# Patient Record
Sex: Female | Born: 1960
Health system: Southern US, Community
[De-identification: ages and names within clinical notes are randomized; demographics above are authoritative.]

## PROBLEM LIST (undated history)

## (undated) DIAGNOSIS — K219 Gastro-esophageal reflux disease without esophagitis: Secondary | ICD-10-CM

## (undated) HISTORY — PX: TUBAL LIGATION: SHX77

## (undated) HISTORY — PX: PATELLA FRACTURE SURGERY: SHX735

---

## 2014-11-03 ENCOUNTER — Emergency Department (HOSPITAL_COMMUNITY): Payer: BLUE CROSS/BLUE SHIELD

## 2014-11-03 ENCOUNTER — Emergency Department (HOSPITAL_COMMUNITY)
Admission: EM | Admit: 2014-11-03 | Discharge: 2014-11-03 | Disposition: A | Discharge status: Home / Self Care | Payer: BLUE CROSS/BLUE SHIELD | Attending: Physician Assistant | Admitting: Physician Assistant

## 2014-11-03 ENCOUNTER — Encounter (HOSPITAL_COMMUNITY): Payer: Self-pay | Admitting: Emergency Medicine

## 2014-11-03 DIAGNOSIS — Y92410 Unspecified street and highway as the place of occurrence of the external cause: Secondary | ICD-10-CM | POA: Diagnosis not present

## 2014-11-03 DIAGNOSIS — S5291XA Unspecified fracture of right forearm, initial encounter for closed fracture: Secondary | ICD-10-CM

## 2014-11-03 DIAGNOSIS — S59291A Other physeal fracture of lower end of radius, right arm, initial encounter for closed fracture: Secondary | ICD-10-CM | POA: Diagnosis not present

## 2014-11-03 DIAGNOSIS — Y9389 Activity, other specified: Secondary | ICD-10-CM | POA: Diagnosis not present

## 2014-11-03 DIAGNOSIS — Z23 Encounter for immunization: Secondary | ICD-10-CM | POA: Diagnosis not present

## 2014-11-03 DIAGNOSIS — Y998 Other external cause status: Secondary | ICD-10-CM | POA: Diagnosis not present

## 2014-11-03 DIAGNOSIS — S52611A Displaced fracture of right ulna styloid process, initial encounter for closed fracture: Secondary | ICD-10-CM | POA: Diagnosis not present

## 2014-11-03 DIAGNOSIS — S6991XA Unspecified injury of right wrist, hand and finger(s), initial encounter: Secondary | ICD-10-CM | POA: Diagnosis present

## 2014-11-03 LAB — BASIC METABOLIC PANEL
Anion gap: 9 (ref 5–15)
BUN: 18 mg/dL (ref 6–20)
CHLORIDE: 106 mmol/L (ref 101–111)
CO2: 21 mmol/L — AB (ref 22–32)
CREATININE: 0.79 mg/dL (ref 0.44–1.00)
Calcium: 8.7 mg/dL — ABNORMAL LOW (ref 8.9–10.3)
GFR calc non Af Amer: >60 mL/min (ref >60)
Glucose, Bld: 99 mg/dL (ref 65–99)
POTASSIUM: 4 mmol/L (ref 3.5–5.1)
SODIUM: 136 mmol/L (ref 135–145)

## 2014-11-03 LAB — CBC WITH DIFFERENTIAL/PLATELET
Basophils Absolute: 0 10*3/uL (ref 0.0–0.1)
Basophils Relative: 0 % (ref 0–1)
EOS ABS: 0.1 10*3/uL (ref 0.0–0.7)
Eosinophils Relative: 1 % (ref 0–5)
HEMATOCRIT: 39.1 % (ref 36.0–46.0)
HEMOGLOBIN: 13.6 g/dL (ref 12.0–15.0)
LYMPHS ABS: 2.6 10*3/uL (ref 0.7–4.0)
LYMPHS PCT: 23 % (ref 12–46)
MCH: 31.3 pg (ref 26.0–34.0)
MCHC: 34.8 g/dL (ref 30.0–36.0)
MCV: 90.1 fL (ref 78.0–100.0)
MONOS PCT: 5 % (ref 3–12)
Monocytes Absolute: 0.6 10*3/uL (ref 0.1–1.0)
NEUTROS PCT: 71 % (ref 43–77)
Neutro Abs: 8 10*3/uL — ABNORMAL HIGH (ref 1.7–7.7)
Platelets: 272 10*3/uL (ref 150–400)
RBC: 4.34 MIL/uL (ref 3.87–5.11)
RDW: 12.4 % (ref 11.5–15.5)
WBC: 11.3 10*3/uL — AB (ref 4.0–10.5)

## 2014-11-03 MED ORDER — IOHEXOL 350 MG/ML SOLN
75.0000 mL | Freq: Once | INTRAVENOUS | Status: AC | PRN
  Administered 2014-11-03: 75 mL via INTRAVENOUS

## 2014-11-03 MED ORDER — TETANUS-DIPHTH-ACELL PERTUSSIS 5-2.5-18.5 LF-MCG/0.5 IM SUSP
0.5000 mL | Freq: Once | INTRAMUSCULAR | Status: AC
  Administered 2014-11-03: 0.5 mL via INTRAMUSCULAR
  Filled 2014-11-03: qty 0.5

## 2014-11-03 MED ORDER — OXYCODONE-ACETAMINOPHEN 5-325 MG PO TABS: 2.0000 | ORAL_TABLET | ORAL | Status: DC | PRN

## 2014-11-03 MED ORDER — OXYCODONE-ACETAMINOPHEN 5-325 MG PO TABS
1.0000 | ORAL_TABLET | Freq: Once | ORAL | Status: AC
  Administered 2014-11-03: 1 via ORAL
  Filled 2014-11-03: qty 1
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
## 2014-11-03 NOTE — ED Notes (Addendum)
Patient was given a prepackage of Oxycodone-Acetaminophen quantity six and instructions to report to [facility] Short Stay at 10 am, NPO after midnight, patient understood all instructions.

## 2014-11-03 NOTE — Discharge Instructions (Signed)
Follow up with [place] in the morning at [facility] "Short Stay" at 10 am..  You should not eat after midnight. You will be having surgery on your wrist.  Cast or Splint Care Casts and splints support injured limbs and keep bones from moving while they heal. It is important to care for your cast or splint at home.  HOME CARE INSTRUCTIONS  Keep the cast or splint uncovered during the drying period. It can take 24 to 48 hours to dry if it is made of plaster. A fiberglass cast will dry in less than 1 hour.  Do not rest the cast on anything harder than a pillow for the first 24 hours.  Do not put weight on your injured limb or apply pressure to the cast until your health care provider gives you permission.  Keep the cast or splint dry. Wet casts or splints can lose their shape and may not support the limb as well. A wet cast that has lost its shape can also create harmful pressure on your skin when it dries. Also, wet skin can become infected.  Cover the cast or splint with a plastic bag when bathing or when out in the rain or snow. If the cast is on the trunk of the body, take sponge baths until the cast is removed.  If your cast does become wet, dry it with a towel or a blow dryer on the cool setting only.  Keep your cast or splint clean. Soiled casts may be wiped with a moistened cloth.  Do not place any hard or soft foreign objects under your cast or splint, such as cotton, toilet paper, lotion, or powder.  Do not try to scratch the skin under the cast with any object. The object could get stuck inside the cast. Also, scratching could lead to an infection. If itching is a problem, use a blow dryer on a cool setting to relieve discomfort.  Do not trim or cut your cast or remove padding from inside of it.  Exercise all joints next to the injury that are not immobilized by the cast or splint. For example, if you have a long leg cast, exercise the hip joint and toes. If you have an arm cast or  splint, exercise the shoulder, elbow, thumb, and fingers.  Elevate your injured arm or leg on 1 or 2 pillows for the first 1 to 3 days to decrease swelling and pain.It is best if you can comfortably elevate your cast so it is higher than your heart. SEEK MEDICAL CARE IF:   Your cast or splint cracks.  Your cast or splint is too tight or too loose.  You have unbearable itching inside the cast.  Your cast becomes wet or develops a soft spot or area.  You have a bad smell coming from inside your cast.  You get an object stuck under your cast.  Your skin around the cast becomes red or raw.  You have new pain or worsening pain after the cast has been applied. SEEK IMMEDIATE MEDICAL CARE IF:   You have fluid leaking through the cast.  You are unable to move your fingers or toes.  You have discolored (blue or white), cool, painful, or very swollen fingers or toes beyond the cast.  You have tingling or numbness around the injured area.  You have severe pain or pressure under the cast.  You have any difficulty with your breathing or have shortness of breath.  You have chest  pain. Document Released: 02/15/2000 Document Revised: 12/08/2012 Document Reviewed: 08/26/2012 [facility] Patient Information 2015 [place], [place]. This information is not intended to replace advice given to you by your health care provider. Make sure you discuss any questions you have with your health care provider.
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
## 2014-11-04 ENCOUNTER — Ambulatory Visit (HOSPITAL_COMMUNITY)
Admission: EM | Admit: 2014-11-04 | Discharge: 2014-11-04 | Disposition: A | Discharge status: Home / Self Care | Payer: BLUE CROSS/BLUE SHIELD | Source: Ambulatory Visit | Attending: Orthopedic Surgery | Admitting: Orthopedic Surgery

## 2014-11-04 ENCOUNTER — Encounter (HOSPITAL_COMMUNITY): Payer: Self-pay | Admitting: *Deleted

## 2014-11-04 ENCOUNTER — Encounter (HOSPITAL_COMMUNITY)
Admission: EM | Disposition: A | Discharge status: Home / Self Care | Payer: Self-pay | Source: Ambulatory Visit | Attending: Orthopedic Surgery

## 2014-11-04 ENCOUNTER — Ambulatory Visit (HOSPITAL_COMMUNITY): Payer: BLUE CROSS/BLUE SHIELD | Admitting: Anesthesiology

## 2014-11-04 DIAGNOSIS — S52571A Other intraarticular fracture of lower end of right radius, initial encounter for closed fracture: Secondary | ICD-10-CM | POA: Diagnosis not present

## 2014-11-04 HISTORY — PX: ORIF WRIST FRACTURE: SHX2133

## 2014-11-04 HISTORY — DX: Gastro-esophageal reflux disease without esophagitis: K21.9

## 2014-11-04 SURGERY — OPEN REDUCTION INTERNAL FIXATION (ORIF) WRIST FRACTURE
Anesthesia: Regional | Site: Wrist | Laterality: Right

## 2014-11-04 MED ORDER — LIDOCAINE HCL (CARDIAC) 20 MG/ML IV SOLN
INTRAVENOUS | Status: DC | PRN
  Administered 2014-11-04: 50 mg via INTRAVENOUS

## 2014-11-04 MED ORDER — LACTATED RINGERS IV SOLN
INTRAVENOUS | Status: DC
  Administered 2014-11-04: 13:00:00 via INTRAVENOUS

## 2014-11-04 MED ORDER — FENTANYL CITRATE (PF) 100 MCG/2ML IJ SOLN: INTRAMUSCULAR | Status: DC | PRN

## 2014-11-04 MED ORDER — OXYCODONE HCL 5 MG PO TABS: 5.0000 mg | ORAL_TABLET | Freq: Once | ORAL | Status: DC | PRN

## 2014-11-04 MED ORDER — MIDAZOLAM HCL 2 MG/2ML IJ SOLN
INTRAMUSCULAR | Status: AC
  Filled 2014-11-04: qty 4

## 2014-11-04 MED ORDER — FENTANYL CITRATE (PF) 250 MCG/5ML IJ SOLN
INTRAMUSCULAR | Status: AC
  Filled 2014-11-04: qty 5

## 2014-11-04 MED ORDER — PROPOFOL 10 MG/ML IV BOLUS
INTRAVENOUS | Status: AC
Start: 2014-11-04 — End: 2014-11-04
  Filled 2014-11-04: qty 20

## 2014-11-04 MED ORDER — BUPIVACAINE-EPINEPHRINE (PF) 0.25% -1:200000 IJ SOLN
INTRAMUSCULAR | Status: DC | PRN
  Administered 2014-11-04: 30 mL via PERINEURAL

## 2014-11-04 MED ORDER — DEXAMETHASONE SODIUM PHOSPHATE 4 MG/ML IJ SOLN
INTRAMUSCULAR | Status: DC | PRN
  Administered 2014-11-04: 4 mg via INTRAVENOUS

## 2014-11-04 MED ORDER — CEPHALEXIN 500 MG PO CAPS: 500.0000 mg | ORAL_CAPSULE | Freq: Four times a day (QID) | ORAL | Status: DC

## 2014-11-04 MED ORDER — MIDAZOLAM HCL 2 MG/2ML IJ SOLN
INTRAMUSCULAR | Status: DC | PRN
  Administered 2014-11-04 (×2): 1 mg via INTRAVENOUS

## 2014-11-04 MED ORDER — SCOPOLAMINE 1 MG/3DAYS TD PT72
MEDICATED_PATCH | TRANSDERMAL | Status: DC | PRN
  Administered 2014-11-04: 1 via TRANSDERMAL

## 2014-11-04 MED ORDER — ONDANSETRON HCL 4 MG/2ML IJ SOLN
INTRAMUSCULAR | Status: AC
  Filled 2014-11-04: qty 2

## 2014-11-04 MED ORDER — HYDROMORPHONE HCL 1 MG/ML IJ SOLN: 0.2500 mg | INTRAMUSCULAR | Status: DC | PRN

## 2014-11-04 MED ORDER — PROMETHAZINE HCL 25 MG/ML IJ SOLN: 6.2500 mg | INTRAMUSCULAR | Status: DC | PRN

## 2014-11-04 MED ORDER — 0.9 % SODIUM CHLORIDE (POUR BTL) OPTIME
TOPICAL | Status: DC | PRN
  Administered 2014-11-04: 1000 mL

## 2014-11-04 MED ORDER — OXYCODONE HCL 5 MG PO TABS: 10.0000 mg | ORAL_TABLET | ORAL | Status: DC | PRN

## 2014-11-04 MED ORDER — ONDANSETRON HCL 4 MG/2ML IJ SOLN
INTRAMUSCULAR | Status: DC | PRN
  Administered 2014-11-04: 4 mg via INTRAVENOUS

## 2014-11-04 MED ORDER — PHENYLEPHRINE HCL 10 MG/ML IJ SOLN
10.0000 mg | INTRAMUSCULAR | Status: DC | PRN
  Administered 2014-11-04: 60 ug/min via INTRAVENOUS

## 2014-11-04 MED ORDER — FENTANYL CITRATE (PF) 250 MCG/5ML IJ SOLN
INTRAMUSCULAR | Status: DC | PRN
  Administered 2014-11-04: 50 ug via INTRAVENOUS
  Administered 2014-11-04 (×2): 100 ug via INTRAVENOUS

## 2014-11-04 MED ORDER — PHENYLEPHRINE HCL 10 MG/ML IJ SOLN
INTRAMUSCULAR | Status: DC | PRN
  Administered 2014-11-04 (×3): 60 ug via INTRAVENOUS

## 2014-11-04 MED ORDER — OXYCODONE HCL 5 MG/5ML PO SOLN: 5.0000 mg | Freq: Once | ORAL | Status: DC | PRN

## 2014-11-04 MED ORDER — DEXAMETHASONE SODIUM PHOSPHATE 4 MG/ML IJ SOLN
INTRAMUSCULAR | Status: AC
  Filled 2014-11-04: qty 1

## 2014-11-04 MED ORDER — PROPOFOL 10 MG/ML IV BOLUS
INTRAVENOUS | Status: DC | PRN
  Administered 2014-11-04: 30 mg via INTRAVENOUS
  Administered 2014-11-04: 170 mg via INTRAVENOUS

## 2014-11-04 MED ORDER — CEFAZOLIN SODIUM-DEXTROSE 2-3 GM-% IV SOLR
INTRAVENOUS | Status: DC | PRN
  Administered 2014-11-04: 2 g via INTRAVENOUS

## 2014-11-04 MED ORDER — METHOCARBAMOL 500 MG PO TABS: 500.0000 mg | ORAL_TABLET | Freq: Four times a day (QID) | ORAL | Status: DC

## 2014-11-04 MED ORDER — LACTATED RINGERS IV SOLN
INTRAVENOUS | Status: DC | PRN
  Administered 2014-11-04 (×2): via INTRAVENOUS

## 2014-11-04 SURGICAL SUPPLY — 63 items
BANDAGE ELASTIC 3 VELCRO ST LF (GAUZE/BANDAGES/DRESSINGS) ×3 IMPLANT
BANDAGE ELASTIC 4 VELCRO ST LF (GAUZE/BANDAGES/DRESSINGS) ×3 IMPLANT
BIT DRILL 2.2 SS TIBIAL (BIT) ×3 IMPLANT
BLADE SURG ROTATE 9660 (MISCELLANEOUS) IMPLANT
BNDG ESMARK 4X9 LF (GAUZE/BANDAGES/DRESSINGS) ×3 IMPLANT
BNDG GAUZE ELAST 4 BULKY (GAUZE/BANDAGES/DRESSINGS) ×3 IMPLANT
CANISTER SUCTION 2500CC (MISCELLANEOUS) ×3 IMPLANT
CORDS BIPOLAR (ELECTRODE) ×3 IMPLANT
COVER SURGICAL LIGHT HANDLE (MISCELLANEOUS) ×3 IMPLANT
CUFF TOURNIQUET SINGLE 18IN (TOURNIQUET CUFF) ×3 IMPLANT
CUFF TOURNIQUET SINGLE 24IN (TOURNIQUET CUFF) IMPLANT
DRAIN TLS ROUND 10FR (DRAIN) IMPLANT
DRAPE OEC MINIVIEW 54X84 (DRAPES) IMPLANT
DRAPE SURG 17X23 STRL (DRAPES) ×3 IMPLANT
DRSG ADAPTIC 3X8 NADH LF (GAUZE/BANDAGES/DRESSINGS) ×3 IMPLANT
GAUZE SPONGE 4X4 12PLY STRL (GAUZE/BANDAGES/DRESSINGS) ×3 IMPLANT
GAUZE XEROFORM 1X8 LF (GAUZE/BANDAGES/DRESSINGS) ×3 IMPLANT
GLOVE BIOGEL M STRL SZ7.5 (GLOVE) ×3 IMPLANT
GLOVE SS BIOGEL STRL SZ 8 (GLOVE) ×1 IMPLANT
GLOVE SUPERSENSE BIOGEL SZ 8 (GLOVE) ×2
GOWN STRL REUS W/ TWL LRG LVL3 (GOWN DISPOSABLE) ×1 IMPLANT
GOWN STRL REUS W/ TWL XL LVL3 (GOWN DISPOSABLE) ×2 IMPLANT
GOWN STRL REUS W/TWL LRG LVL3 (GOWN DISPOSABLE) ×2
GOWN STRL REUS W/TWL XL LVL3 (GOWN DISPOSABLE) ×4
KIT BASIN OR (CUSTOM PROCEDURE TRAY) ×3 IMPLANT
KIT ROOM TURNOVER OR (KITS) ×3 IMPLANT
LOOP VESSEL MAXI BLUE (MISCELLANEOUS) IMPLANT
MANIFOLD NEPTUNE II (INSTRUMENTS) ×3 IMPLANT
NEEDLE 22X1 1/2 (OR ONLY) (NEEDLE) IMPLANT
NS IRRIG 1000ML POUR BTL (IV SOLUTION) ×3 IMPLANT
PACK ORTHO EXTREMITY (CUSTOM PROCEDURE TRAY) ×3 IMPLANT
PAD ARMBOARD 7.5X6 YLW CONV (MISCELLANEOUS) ×6 IMPLANT
PAD CAST 3X4 CTTN HI CHSV (CAST SUPPLIES) ×1 IMPLANT
PAD CAST 4YDX4 CTTN HI CHSV (CAST SUPPLIES) ×1 IMPLANT
PADDING CAST ABS 3INX4YD NS (CAST SUPPLIES) ×2
PADDING CAST ABS COTTON 3X4 (CAST SUPPLIES) ×1 IMPLANT
PADDING CAST COTTON 3X4 STRL (CAST SUPPLIES) ×2
PADDING CAST COTTON 4X4 STRL (CAST SUPPLIES) ×2
PEG LOCKING SMOOTH 2.2X16 (Screw) ×6 IMPLANT
PEG LOCKING SMOOTH 2.2X18 (Peg) ×9 IMPLANT
PLATE DVR VOLAR RIM NARROW RT (Plate) ×3 IMPLANT
SCREW LOCK 12X2.7X 3 LD (Screw) ×2 IMPLANT
SCREW LOCK 14X2.7X 3 LD TPR (Screw) ×2 IMPLANT
SCREW LOCKING 2.7X12MM (Screw) ×4 IMPLANT
SCREW LOCKING 2.7X13MM (Screw) ×3 IMPLANT
SCREW LOCKING 2.7X14 (Screw) ×4 IMPLANT
SPLINT FIBERGLASS 3X12 (CAST SUPPLIES) ×3 IMPLANT
SPONGE LAP 4X18 X RAY DECT (DISPOSABLE) IMPLANT
STOCKINETTE TUBULAR COTT 4X25 (GAUZE/BANDAGES/DRESSINGS) ×3 IMPLANT
SUT MNCRL AB 4-0 PS2 18 (SUTURE) ×3 IMPLANT
SUT PROLENE 3 0 PS 2 (SUTURE) IMPLANT
SUT PROLENE 4 0 PS 2 18 (SUTURE) ×3 IMPLANT
SUT VIC AB 3-0 FS2 27 (SUTURE) IMPLANT
SUT VIC AB 3-0 PS2 18 (SUTURE) ×2
SUT VIC AB 3-0 PS2 18XBRD (SUTURE) ×1 IMPLANT
SYR CONTROL 10ML LL (SYRINGE) IMPLANT
SYSTEM CHEST DRAIN TLS 7FR (DRAIN) IMPLANT
TOWEL OR 17X24 6PK STRL BLUE (TOWEL DISPOSABLE) ×3 IMPLANT
TOWEL OR 17X26 10 PK STRL BLUE (TOWEL DISPOSABLE) ×3 IMPLANT
TUBE CONNECTING 12'X1/4 (SUCTIONS) ×1
TUBE CONNECTING 12X1/4 (SUCTIONS) ×2 IMPLANT
TUBE EVACUATION TLS (MISCELLANEOUS) ×3 IMPLANT
WATER STERILE IRR 1000ML POUR (IV SOLUTION) ×3 IMPLANT

## 2014-11-04 NOTE — Anesthesia Preprocedure Evaluation (Addendum)
Anesthesia Evaluation  Patient identified by MRN, date of birth, ID band Patient awake    Reviewed: Allergy & Precautions, NPO status , Patient's Chart, lab work & pertinent test results  Airway Mallampati: II  TM Distance: >3 FB Neck ROM: Full    Dental  (+) Dental Advisory Given, Teeth Intact   Pulmonary neg pulmonary ROS,  breath sounds clear to auscultation        Cardiovascular negative cardio ROS  Rhythm:Regular Rate:Normal     Neuro/Psych negative neurological ROS     GI/Hepatic Neg liver ROS, GERD-  ,  Endo/Other  negative endocrine ROS  Renal/GU negative Renal ROS     Musculoskeletal   Abdominal   Peds  Hematology negative hematology ROS (+)   Anesthesia Other Findings   Reproductive/Obstetrics                           Anesthesia Physical Anesthesia Plan  ASA: I  Anesthesia Plan: General and Regional   Post-op Pain Management: GA combined w/ Regional for post-op pain   Induction: Intravenous  Airway Management Planned: LMA  Additional Equipment:   Intra-op Plan:   Post-operative Plan: Extubation in OR  Informed Consent: I have reviewed the patients History and Physical, chart, labs and discussed the procedure including the risks, benefits and alternatives for the proposed anesthesia with the patient or authorized representative who has indicated his/her understanding and acceptance.   Dental advisory given  Plan Discussed with: CRNA  Anesthesia Plan Comments:         Anesthesia Quick Evaluation

## 2014-11-04 NOTE — Anesthesia Procedure Notes (Addendum)
Anesthesia Regional Block:  Axillary brachial plexus block  Pre-Anesthetic Checklist: ,, timeout performed, Correct Patient, Correct Site, Correct Laterality, Correct Procedure, Correct Position, site marked, Risks and benefits discussed,  Surgical consent,  Pre-op evaluation,  At surgeon's request and post-op pain management  Laterality: Right  Prep: chloraprep       Needles:   Needle Type: Echogenic Stimulator Needle     Needle Length: 9cm 9 cm Needle Gauge: 21 and 21 G    Additional Needles:  Procedures: ultrasound guided (picture in chart) and nerve stimulator Axillary brachial plexus block  Nerve Stimulator or Paresthesia:  Response: musculocutaneous, 0.5 mA,  Response: radial, 0.5 mA,  Response: median , 0.5 mA,   Additional Responses:  Ulnar @ 0.67mA Narrative:  Start time: 11/04/2014 2:00 PM End time: 11/04/2014 2:10 PM Injection made incrementally with aspirations every 5 mL.  Performed by: Personally  Anesthesiologist: Suzette Battiest  Additional Notes: Risks and benefits discussed. Pt tolerated well with no immediate complications.   Procedure Name: Intubation Date/Time: 11/04/2014 3:21 PM Performed by: Marinda Elk A Pre-anesthesia Checklist: Patient identified, Timeout performed, Emergency Drugs available, Suction available and Patient being monitored Patient Re-evaluated:Patient Re-evaluated prior to inductionOxygen Delivery Method: Circle system utilized Preoxygenation: Pre-oxygenation with 100% oxygen Intubation Type: IV induction Laryngoscope Size: Mac and 3 Grade View: Grade I Tube type: Oral Tube size: 7.5 mm Number of attempts: 1 Airway Equipment and Method: Stylet Placement Confirmation: ETT inserted through vocal cords under direct vision,  breath sounds checked- equal and bilateral and positive ETCO2 Secured at: 22 cm Tube secured with: Tape Dental Injury: Teeth and Oropharynx as per pre-operative assessment

## 2014-11-04 NOTE — Progress Notes (Signed)
Orthopedic Tech Progress Note Patient Details:  Alexis Sandoval 01-02-1961 [id number] Patient already has arm sling none needed. Patient ID: KADEE PHILYAW, female   DOB: 12-07-1960, 54 y.o.   MRN: [id number]   Braulio Bosch 11/04/2014, 5:56 PM

## 2014-11-04 NOTE — Anesthesia Postprocedure Evaluation (Addendum)
  Anesthesia Post-op Note  Patient: Alexis Sandoval  Procedure(s) Performed: Procedure(s): OPEN REDUCTION INTERNAL FIXATION (ORIF) WRIST FRACTURE (Right)  Patient Location: PACU  Anesthesia Type:General and GA combined with regional for post-op pain  Level of Consciousness: awake and alert   Airway and Oxygen Therapy: Patient Spontanous Breathing  Post-op Pain: none  Post-op Assessment: Post-op Vital signs reviewed              Post-op Vital Signs: Reviewed  Last Vitals:  Filed Vitals:   11/04/14 1705  BP: 132/83  Pulse: 98  Temp:   Resp: 21    Complications: No apparent anesthesia complications
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
## 2014-11-04 NOTE — Progress Notes (Signed)
Orthopedic Tech Progress Note Patient Details:  Alexis Sandoval 05-Mar-1960 [id number]  Ortho Devices Type of Ortho Device: Arm sling Ortho Device/Splint Location: RUE Ortho Device/Splint Interventions: Ordered, Application   Braulio Bosch 11/04/2014, 5:54 PM

## 2014-11-04 NOTE — Op Note (Signed)
See dictation#[id number]  SP ORIF right radius  Benney Sommerville MD

## 2014-11-04 NOTE — Transfer of Care (Signed)
Immediate Anesthesia Transfer of Care Note  Patient: Alexis Sandoval  Procedure(s) Performed: Procedure(s): OPEN REDUCTION INTERNAL FIXATION (ORIF) WRIST FRACTURE (Right)  Patient Location: PACU  Anesthesia Type:General  Level of Consciousness: awake  Airway & Oxygen Therapy: Patient Spontanous Breathing  Post-op Assessment: Report given to RN and Post -op Vital signs reviewed and stable  Post vital signs: Reviewed and stable  Last Vitals:  Filed Vitals:   11/04/14 1144  BP: 130/73  Pulse: 62  Temp: 36.4 C  Resp: 18    Complications: No apparent anesthesia complications
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
## 2014-11-07 ENCOUNTER — Encounter (HOSPITAL_COMMUNITY): Payer: Self-pay | Admitting: Orthopedic Surgery

## 2014-11-08 MED FILL — Oxycodone w/ Acetaminophen Tab 5-325 MG: ORAL | Qty: 6 | Status: AC

## 2014-11-09 ENCOUNTER — Encounter (HOSPITAL_COMMUNITY): Payer: Self-pay | Admitting: Orthopedic Surgery

## 2019-03-12 DIAGNOSIS — Z23 Encounter for immunization: Secondary | ICD-10-CM | POA: Diagnosis not present

## 2019-04-09 DIAGNOSIS — Z23 Encounter for immunization: Secondary | ICD-10-CM | POA: Diagnosis not present

## 2020-10-23 ENCOUNTER — Encounter: Payer: Self-pay | Admitting: Family Medicine

## 2020-10-23 ENCOUNTER — Other Ambulatory Visit: Payer: Self-pay

## 2020-10-23 ENCOUNTER — Ambulatory Visit: Payer: BLUE CROSS/BLUE SHIELD | Admitting: Family Medicine

## 2020-10-23 ENCOUNTER — Ambulatory Visit (INDEPENDENT_AMBULATORY_CARE_PROVIDER_SITE_OTHER): Payer: BLUE CROSS/BLUE SHIELD

## 2020-10-23 ENCOUNTER — Other Ambulatory Visit (HOSPITAL_COMMUNITY)
Admission: RE | Admit: 2020-10-23 | Discharge: 2020-10-23 | Disposition: A | Discharge status: Home / Self Care | Payer: BLUE CROSS/BLUE SHIELD | Source: Ambulatory Visit | Attending: Family Medicine | Admitting: Family Medicine

## 2020-10-23 VITALS — BP 121/73 | HR 60 | Temp 98.0°F | Resp 20 | Ht 62.0 in | Wt 161.0 lb

## 2020-10-23 DIAGNOSIS — G479 Sleep disorder, unspecified: Secondary | ICD-10-CM

## 2020-10-23 DIAGNOSIS — Z6829 Body mass index (BMI) 29.0-29.9, adult: Secondary | ICD-10-CM | POA: Diagnosis not present

## 2020-10-23 DIAGNOSIS — Z13820 Encounter for screening for osteoporosis: Secondary | ICD-10-CM | POA: Diagnosis not present

## 2020-10-23 DIAGNOSIS — Z0001 Encounter for general adult medical examination with abnormal findings: Secondary | ICD-10-CM

## 2020-10-23 DIAGNOSIS — Z780 Asymptomatic menopausal state: Secondary | ICD-10-CM

## 2020-10-23 DIAGNOSIS — Z124 Encounter for screening for malignant neoplasm of cervix: Secondary | ICD-10-CM | POA: Diagnosis not present

## 2020-10-23 DIAGNOSIS — Z1231 Encounter for screening mammogram for malignant neoplasm of breast: Secondary | ICD-10-CM

## 2020-10-23 DIAGNOSIS — R7303 Prediabetes: Secondary | ICD-10-CM | POA: Insufficient documentation

## 2020-10-23 DIAGNOSIS — Z1159 Encounter for screening for other viral diseases: Secondary | ICD-10-CM | POA: Diagnosis not present

## 2020-10-23 DIAGNOSIS — Z0000 Encounter for general adult medical examination without abnormal findings: Secondary | ICD-10-CM

## 2020-10-23 DIAGNOSIS — E782 Mixed hyperlipidemia: Secondary | ICD-10-CM | POA: Diagnosis not present

## 2020-10-23 DIAGNOSIS — M8589 Other specified disorders of bone density and structure, multiple sites: Secondary | ICD-10-CM | POA: Diagnosis not present

## 2020-10-23 DIAGNOSIS — Z114 Encounter for screening for human immunodeficiency virus [HIV]: Secondary | ICD-10-CM | POA: Diagnosis not present

## 2020-10-23 LAB — BAYER DCA HB A1C WAIVED: HB A1C (BAYER DCA - WAIVED): 5.8 % (ref <7.0)

## 2020-10-23 NOTE — Progress Notes (Signed)
Subjective:  Patient ID: Alexis Sandoval, female    DOB: March 16, 1960, 60 y.o.   MRN: [id number]  Patient Care Team: Baruch Gouty, FNP as PCP - General (Family Medicine)   Chief Complaint:  Establish Care (NEW )   HPI: Alexis Sandoval is a 60 y.o. female presenting on 10/23/2020 for Establish Care (NEW )   Pt presents today to establish care with new PCP. She was last followed by Dr. Edrick Oh who is no longer practicing. She has not been to a PCP in over 4 years. States she has had a colonoscopy in the last 5 years, we will request records. No recent PAP or mammogram. Prior labs revealed hyperlipidemia and prediabetes. She is not on medications for either. No recent DEXA scan, she is postmenopausal and not on calcium repletion therapy. She has had intermittent vertigo but reports this has resolved. Her biggest concern in trouble sleeping. States it is hard for her to fall asleep and stay asleep. States this has been going on for several months. She has not tried anything over the counter to help her sleep. She states she has a hard time relaxing as well. No other depression or anxiety symptoms reported. Nothing seems to worsen or improve symptoms.   Relevant past medical, surgical, family, and social history reviewed and updated as indicated.  Allergies and medications reviewed and updated. Data reviewed: Chart in Epic.   Past Medical History:  Diagnosis Date   GERD (gastroesophageal reflux disease)    does not take anything for it    Past Surgical History:  Procedure Laterality Date   ORIF WRIST FRACTURE Right 11/04/2014   Procedure: OPEN REDUCTION INTERNAL FIXATION (ORIF) WRIST FRACTURE;  Surgeon: Roseanne Kaufman, MD;  Location: [place];  Service: Orthopedics;  Laterality: Right;   PATELLA FRACTURE SURGERY     TUBAL LIGATION      Social History   Socioeconomic History   Marital status: Married    Spouse name: Kyung Rudd   Number of children: 2   Years of education: Not on file    Highest education level: Not on file  Occupational History   Not on file  Tobacco Use   Smoking status: Never   Smokeless tobacco: Never  Vaping Use   Vaping Use: Never used  Substance and Sexual Activity   Alcohol use: No   Drug use: No   Sexual activity: Not on file  Other Topics Concern   Not on file  Social History Narrative   Not on file   Social Determinants of Health   Financial Resource Strain: Not on file  Food Insecurity: Not on file  Transportation Needs: Not on file  Physical Activity: Not on file  Stress: Not on file  Social Connections: Not on file  Intimate Partner Violence: Not on file    Outpatient Encounter Medications as of 10/23/2020  Medication Sig   [DISCONTINUED] cephALEXin (KEFLEX) 500 MG capsule Take 1 capsule (500 mg total) by mouth 4 (four) times daily.   [DISCONTINUED] methocarbamol (ROBAXIN) 500 MG tablet Take 1 tablet (500 mg total) by mouth 4 (four) times daily.   [DISCONTINUED] oxyCODONE (OXY IR/ROXICODONE) 5 MG immediate release tablet Take 2 tablets (10 mg total) by mouth every 4 (four) hours as needed for severe pain.   [DISCONTINUED] oxyCODONE-acetaminophen (PERCOCET/ROXICET) 5-325 MG per tablet Take 2 tablets by mouth every 4 (four) hours as needed for severe pain.   No facility-administered encounter medications on file as of 10/23/2020.  No Known Allergies  Review of Systems  Constitutional:  Negative for activity change, appetite change, chills, diaphoresis, fatigue, fever and unexpected weight change.  HENT: Negative.    Eyes: Negative.   Respiratory:  Negative for apnea, cough, choking, chest tightness, shortness of breath, wheezing and stridor.   Cardiovascular:  Negative for chest pain, palpitations and leg swelling.  Gastrointestinal:  Negative for abdominal distention, abdominal pain, anal bleeding, blood in stool, constipation, diarrhea, nausea, rectal pain and vomiting.  Endocrine: Negative.  Negative for cold  intolerance, heat intolerance, polydipsia, polyphagia and polyuria.  Genitourinary:  Negative for decreased urine volume, difficulty urinating, dyspareunia, dysuria, enuresis, frequency, genital sores, hematuria, menstrual problem, pelvic pain, urgency, vaginal bleeding, vaginal discharge and vaginal pain.  Musculoskeletal:  Negative for arthralgias and myalgias.  Skin: Negative.   Allergic/Immunologic: Negative.   Neurological:  Negative for dizziness, tremors, seizures, syncope, facial asymmetry, speech difficulty, weakness, light-headedness, numbness and headaches.  Hematological: Negative.   Psychiatric/Behavioral:  Positive for sleep disturbance. Negative for agitation, behavioral problems, confusion, decreased concentration, dysphoric mood, hallucinations, self-injury and suicidal ideas. The patient is not nervous/anxious and is not hyperactive.   All other systems reviewed and are negative.      Objective:  BP 121/73   Pulse 60   Temp 98 F (36.7 C)   Resp 20   Ht '5\' 2"'  (1.575 m)   Wt 161 lb (73 kg)   SpO2 99%   BMI 29.45 kg/m    Wt Readings from Last 3 Encounters:  10/23/20 161 lb (73 kg)  11/03/14 165 lb (74.8 kg)    Physical Exam Vitals and nursing note reviewed. Exam conducted with a chaperone present.  Constitutional:      General: She is not in acute distress.    Appearance: Normal appearance. She is well-developed, well-groomed and overweight. She is not ill-appearing, toxic-appearing or diaphoretic.  HENT:     Head: Normocephalic and atraumatic.     Jaw: There is normal jaw occlusion.     Right Ear: Hearing, tympanic membrane, ear canal and external ear normal.     Left Ear: Hearing, tympanic membrane, ear canal and external ear normal.     Nose: Nose normal.     Mouth/Throat:     Lips: Pink.     Mouth: Mucous membranes are moist.     Pharynx: Oropharynx is clear. Uvula midline.  Eyes:     General: Lids are normal.     Extraocular Movements: Extraocular  movements intact.     Conjunctiva/sclera: Conjunctivae normal.     Pupils: Pupils are equal, round, and reactive to light.  Neck:     Thyroid: No thyroid mass, thyromegaly or thyroid tenderness.     Vascular: No carotid bruit or JVD.     Trachea: Trachea and phonation normal.  Cardiovascular:     Rate and Rhythm: Normal rate and regular rhythm.     Chest Wall: PMI is not displaced.     Pulses: Normal pulses.     Heart sounds: Normal heart sounds. No murmur heard.   No friction rub. No gallop.  Pulmonary:     Effort: Pulmonary effort is normal. No respiratory distress.     Breath sounds: Normal breath sounds. No stridor. No wheezing, rhonchi or rales.  Chest:     Chest wall: No tenderness.  Abdominal:     General: Bowel sounds are normal. There is no distension or abdominal bruit.     Palpations: Abdomen is soft. There is no  hepatomegaly or splenomegaly.     Tenderness: There is no abdominal tenderness. There is no right CVA tenderness or left CVA tenderness.     Hernia: No hernia is present. There is no hernia in the left inguinal area or right inguinal area.  Genitourinary:    General: Normal vulva.     Exam position: Lithotomy position.     Pubic Area: No rash or pubic lice.      Tanner stage (genital): 5.     Labia:        Right: No rash, tenderness, lesion or injury.        Left: No rash, tenderness, lesion or injury.      Urethra: No prolapse, urethral pain, urethral swelling or urethral lesion.     Vagina: Normal.     Cervix: Normal.     Uterus: Normal.      Adnexa: Right adnexa normal and left adnexa normal.     Rectum: Normal.  Musculoskeletal:        General: Normal range of motion.     Cervical back: Normal range of motion and neck supple.     Right lower leg: No edema.     Left lower leg: No edema.  Lymphadenopathy:     Cervical: No cervical adenopathy.     Lower Body: No right inguinal adenopathy. No left inguinal adenopathy.  Skin:    General: Skin is warm  and dry.     Capillary Refill: Capillary refill takes less than 2 seconds.     Coloration: Skin is not cyanotic, jaundiced or pale.     Findings: No rash.  Neurological:     General: No focal deficit present.     Mental Status: She is alert and oriented to person, place, and time.     Cranial Nerves: Cranial nerves are intact. No cranial nerve deficit.     Sensory: Sensation is intact. No sensory deficit.     Motor: Motor function is intact. No weakness.     Coordination: Coordination is intact. Coordination normal.     Gait: Gait is intact. Gait normal.     Deep Tendon Reflexes: Reflexes are normal and symmetric. Reflexes normal.  Psychiatric:        Attention and Perception: Attention and perception normal.        Mood and Affect: Mood and affect normal.        Speech: Speech normal.        Behavior: Behavior normal. Behavior is cooperative.        Thought Content: Thought content normal.        Cognition and Memory: Cognition and memory normal.        Judgment: Judgment normal.    Results for orders placed or performed in visit on 10/23/20  Bayer DCA Hb A1c Waived  Result Value Ref Range   HB A1C (BAYER DCA - WAIVED) 5.8 <7.0 %       Pertinent labs & imaging results that were available during my care of the patient were reviewed by me and considered in my medical decision making.  Assessment & Plan:  Jamilett was seen today for establish care.  Diagnoses and all orders for this visit:  Annual physical exam Health maintenance discussed in detail. Labs pending. DEXA ordered. Mammogram ordered. Will obtain colonoscopy results. PAP completed today.  -     CBC with Differential/Platelet -     CMP14+EGFR -     Lipid panel -  Thyroid Panel With TSH -     VITAMIN D 25 Hydroxy (Vit-D Deficiency, Fractures) -     Hepatitis C antibody -     HIV Antibody (routine testing w rflx) -     DG [facility] DEXA; Future -     Bayer DCA Hb A1c Waived -     MM 3D SCREEN BREAST BILATERAL;  Future  Prediabetes A1C 5.8 in office today. Diet and exercise discussed in detail. Will continue to monitor.  -     CMP14+EGFR -     Bayer DCA Hb A1c Waived  Mixed hyperlipidemia Not on medications. Lipids pending, will discuss treatment options once labs have resulted.  -     Lipid panel  BMI 29.0-29.9,adult Diet and exercise encouraged. Labs pending.  -     CBC with Differential/Platelet -     CMP14+EGFR -     Lipid panel -     Thyroid Panel With TSH -     VITAMIN D 25 Hydroxy (Vit-D Deficiency, Fractures) -     Bayer DCA Hb A1c Waived  Postmenopausal Will obtain DEXA to evaluate for osteoporosis.  -     DG [facility] DEXA; Future  Screening for cervical cancer Pap completed today.  -     Cytology - PAP([facility])  Encounter for screening mammogram for malignant neoplasm of breast Mammogram ordered.  -     MM 3D SCREEN BREAST BILATERAL; Future  Screening for osteoporosis Postmenopausal, will obtain DEXA.  -     DG [facility] DEXA; Future  Screening for HIV (human immunodeficiency virus) -     HIV Antibody (routine testing w rflx)  Encounter for hepatitis C screening test for low risk patient -     Hepatitis C antibody  Difficulty sleeping Sleep hygiene discussed in detail. Pt to trial melatonin OTC, start with 2-3 mg nightly, can increase dosage up to 12 mg nightly. Report any new, worsening, or persistent symptoms.   Continue all other maintenance medications.  Follow up plan: Return in about 3 months (around 01/23/2021), or if symptoms worsen or fail to improve.   Continue healthy lifestyle choices, including diet (rich in fruits, vegetables, and lean proteins, and low in salt and simple carbohydrates) and exercise (at least 30 minutes of moderate physical activity daily).  Educational handout given for health maintenance  The above assessment and management plan was discussed with the patient. The patient verbalized understanding of and has agreed to the management  plan. Patient is aware to call the clinic if they develop any new symptoms or if symptoms persist or worsen. Patient is aware when to return to the clinic for a follow-up visit. Patient educated on when it is appropriate to go to the emergency department.   Monia Pouch, FNP-C [facility] [phone]

## 2020-10-24 ENCOUNTER — Other Ambulatory Visit: Payer: Self-pay | Admitting: Family Medicine

## 2020-10-24 DIAGNOSIS — R87618 Other abnormal cytological findings on specimens from cervix uteri: Secondary | ICD-10-CM

## 2020-10-24 LAB — LIPID PANEL
Chol/HDL Ratio: 4.3 ratio (ref 0.0–4.4)
Cholesterol, Total: 260 mg/dL — ABNORMAL HIGH (ref 100–199)
HDL: 60 mg/dL (ref >39)
LDL Chol Calc (NIH): 171 mg/dL — ABNORMAL HIGH (ref 0–99)
Triglycerides: 161 mg/dL — ABNORMAL HIGH (ref 0–149)
VLDL Cholesterol Cal: 29 mg/dL (ref 5–40)

## 2020-10-24 LAB — CBC WITH DIFFERENTIAL/PLATELET
Basophils Absolute: 0 10*3/uL (ref 0.0–0.2)
Basos: 1 %
EOS (ABSOLUTE): 0.1 10*3/uL (ref 0.0–0.4)
Eos: 3 %
Hematocrit: 39.2 % (ref 34.0–46.6)
Hemoglobin: 13.5 g/dL (ref 11.1–15.9)
Immature Grans (Abs): 0 10*3/uL (ref 0.0–0.1)
Immature Granulocytes: 0 %
Lymphocytes Absolute: 2.2 10*3/uL (ref 0.7–3.1)
Lymphs: 42 %
MCH: 31.3 pg (ref 26.6–33.0)
MCHC: 34.4 g/dL (ref 31.5–35.7)
MCV: 91 fL (ref 79–97)
Monocytes Absolute: 0.4 10*3/uL (ref 0.1–0.9)
Monocytes: 7 %
Neutrophils Absolute: 2.5 10*3/uL (ref 1.4–7.0)
Neutrophils: 47 %
Platelets: 275 10*3/uL (ref 150–450)
RBC: 4.31 x10E6/uL (ref 3.77–5.28)
RDW: 11.2 % — ABNORMAL LOW (ref 11.7–15.4)
WBC: 5.2 10*3/uL (ref 3.4–10.8)

## 2020-10-24 LAB — CMP14+EGFR
ALT: 22 IU/L (ref 0–32)
AST: 21 IU/L (ref 0–40)
Albumin/Globulin Ratio: 1.7 (ref 1.2–2.2)
Albumin: 4.6 g/dL (ref 3.8–4.8)
Alkaline Phosphatase: 83 IU/L (ref 44–121)
BUN/Creatinine Ratio: 16 (ref 12–28)
BUN: 14 mg/dL (ref 8–27)
Bilirubin Total: 0.5 mg/dL (ref 0.0–1.2)
CO2: 21 mmol/L (ref 20–29)
Calcium: 9.1 mg/dL (ref 8.7–10.3)
Chloride: 104 mmol/L (ref 96–106)
Creatinine, Ser: 0.85 mg/dL (ref 0.57–1.00)
Globulin, Total: 2.7 g/dL (ref 1.5–4.5)
Glucose: 94 mg/dL (ref 65–99)
Potassium: 4.1 mmol/L (ref 3.5–5.2)
Sodium: 140 mmol/L (ref 134–144)
Total Protein: 7.3 g/dL (ref 6.0–8.5)
eGFR: 78 mL/min/{1.73_m2} (ref >59)

## 2020-10-24 LAB — CYTOLOGY - PAP
Adequacy: ABSENT
Comment: NEGATIVE
Diagnosis: NEGATIVE
High risk HPV: POSITIVE — AB

## 2020-10-24 LAB — THYROID PANEL WITH TSH
Free Thyroxine Index: 2 (ref 1.2–4.9)
T3 Uptake Ratio: 25 % (ref 24–39)
T4, Total: 8.1 ug/dL (ref 4.5–12.0)
TSH: 2.12 u[IU]/mL (ref 0.450–4.500)

## 2020-10-24 LAB — HEPATITIS C ANTIBODY: Hep C Virus Ab: <0.1 s/co ratio (ref 0.0–0.9)

## 2020-10-24 LAB — HIV ANTIBODY (ROUTINE TESTING W REFLEX): HIV Screen 4th Generation wRfx: NONREACTIVE

## 2020-10-24 LAB — VITAMIN D 25 HYDROXY (VIT D DEFICIENCY, FRACTURES): Vit D, 25-Hydroxy: 8.4 ng/mL — ABNORMAL LOW (ref 30.0–100.0)

## 2020-10-24 MED ORDER — VITAMIN D (ERGOCALCIFEROL) 1.25 MG (50000 UNIT) PO CAPS: 50000.0000 [IU] | ORAL_CAPSULE | ORAL | 2 refills | Status: DC

## 2020-10-24 MED ORDER — ATORVASTATIN CALCIUM 20 MG PO TABS: 20.0000 mg | ORAL_TABLET | Freq: Every day | ORAL | 3 refills | Status: DC

## 2020-12-26 ENCOUNTER — Other Ambulatory Visit: Payer: Self-pay

## 2020-12-26 ENCOUNTER — Telehealth: Payer: Self-pay | Admitting: Family Medicine

## 2020-12-27 DIAGNOSIS — Z0000 Encounter for general adult medical examination without abnormal findings: Secondary | ICD-10-CM

## 2020-12-27 DIAGNOSIS — R928 Other abnormal and inconclusive findings on diagnostic imaging of breast: Secondary | ICD-10-CM

## 2020-12-27 DIAGNOSIS — Z1231 Encounter for screening mammogram for malignant neoplasm of breast: Secondary | ICD-10-CM

## 2020-12-27 DIAGNOSIS — R921 Mammographic calcification found on diagnostic imaging of breast: Secondary | ICD-10-CM

## 2020-12-28 ENCOUNTER — Other Ambulatory Visit: Payer: Self-pay | Admitting: Family Medicine

## 2020-12-28 DIAGNOSIS — R928 Other abnormal and inconclusive findings on diagnostic imaging of breast: Secondary | ICD-10-CM

## 2020-12-28 DIAGNOSIS — R921 Mammographic calcification found on diagnostic imaging of breast: Secondary | ICD-10-CM

## 2020-12-28 NOTE — Telephone Encounter (Signed)
Signed orders were faxed to the office

## 2021-01-03 ENCOUNTER — Telehealth: Payer: Self-pay | Admitting: Family Medicine

## 2021-01-03 NOTE — Telephone Encounter (Signed)
Abigail Butts called from [place] to let us know that the order sent for pt to have Stereo Tactic Biopsy of RT Breast can only be done at [facility].  Pt has appt scheduled for it tomorrow.  Send order to fax # [phone]

## 2021-01-03 NOTE — Telephone Encounter (Signed)
Spoke to [employer], [employer].  She corrected the ordrer

## 2021-01-04 ENCOUNTER — Ambulatory Visit
Admission: RE | Admit: 2021-01-04 | Discharge: 2021-01-04 | Disposition: A | Discharge status: Home / Self Care | Payer: PRIVATE HEALTH INSURANCE | Source: Ambulatory Visit | Attending: Family Medicine | Admitting: Family Medicine

## 2021-01-04 ENCOUNTER — Other Ambulatory Visit: Payer: Self-pay

## 2021-01-04 DIAGNOSIS — R928 Other abnormal and inconclusive findings on diagnostic imaging of breast: Secondary | ICD-10-CM

## 2021-01-04 DIAGNOSIS — R921 Mammographic calcification found on diagnostic imaging of breast: Secondary | ICD-10-CM

## 2021-01-30 ENCOUNTER — Other Ambulatory Visit: Payer: Self-pay | Admitting: Family Medicine

## 2021-01-30 ENCOUNTER — Encounter: Payer: Self-pay | Admitting: Family Medicine

## 2021-01-30 ENCOUNTER — Ambulatory Visit (INDEPENDENT_AMBULATORY_CARE_PROVIDER_SITE_OTHER): Payer: PRIVATE HEALTH INSURANCE | Admitting: Family Medicine

## 2021-01-30 VITALS — BP 118/77 | HR 66 | Temp 97.6°F | Ht 62.0 in | Wt 164.0 lb

## 2021-01-30 DIAGNOSIS — Z1211 Encounter for screening for malignant neoplasm of colon: Secondary | ICD-10-CM

## 2021-01-30 DIAGNOSIS — R7303 Prediabetes: Secondary | ICD-10-CM | POA: Diagnosis not present

## 2021-01-30 DIAGNOSIS — M8589 Other specified disorders of bone density and structure, multiple sites: Secondary | ICD-10-CM | POA: Insufficient documentation

## 2021-01-30 DIAGNOSIS — R918 Other nonspecific abnormal finding of lung field: Secondary | ICD-10-CM

## 2021-01-30 DIAGNOSIS — E782 Mixed hyperlipidemia: Secondary | ICD-10-CM

## 2021-01-30 DIAGNOSIS — E559 Vitamin D deficiency, unspecified: Secondary | ICD-10-CM | POA: Insufficient documentation

## 2021-01-30 DIAGNOSIS — Z1212 Encounter for screening for malignant neoplasm of rectum: Secondary | ICD-10-CM

## 2021-01-30 LAB — BAYER DCA HB A1C WAIVED: HB A1C (BAYER DCA - WAIVED): 6 % — ABNORMAL HIGH (ref 4.8–5.6)

## 2021-01-30 NOTE — Progress Notes (Signed)
Subjective:  Patient ID: Alexis Sandoval, female    DOB: Oct 08, 1960, 60 y.o.   MRN: [id number]  Patient Care Team: Baruch Gouty, FNP as PCP - General (Family Medicine)   Chief Complaint:  Medical Management of Chronic Issues   HPI: Alexis Sandoval is a 60 y.o. female presenting on 01/30/2021 for Medical Management of Chronic Issues   Patient presents today for 30-monthfollow-up.  Initial visit she was noted to have prediabetes with an A1c of 5.8.  Her vitamin D was significantly elevated and she was started on repletion therapy.  Her DEXA scan revealed osteopenia and she was started on calcium and vitamin D supplementation.  She had an abnormal Pap.  GYN follow-up, please schedule repeat Pap in 1 year.  Mammogram revealed a benign follow-up area.  Patient underwent biopsy which turned out to be benign.  Her cholesterol was also noted to be elevated she was started on statin therapy.  She states she is unable to tolerate daily statin therapy due to myalgias and is taking every other day. She has a prior history of lung nodules and has not had a follow up scan in over 10 years. Denies symptoms.  She has not been watching her diet or exercising on a regular basis.    Relevant past medical, surgical, family, and social history reviewed and updated as indicated.  Allergies and medications reviewed and updated. Data reviewed: Chart in Epic.   Past Medical History:  Diagnosis Date   GERD (gastroesophageal reflux disease)    does not take anything for it    Past Surgical History:  Procedure Laterality Date   ORIF WRIST FRACTURE Right 11/04/2014   Procedure: OPEN REDUCTION INTERNAL FIXATION (ORIF) WRIST FRACTURE;  Surgeon: WRoseanne Kaufman MD;  [facility]  Service: Orthopedics;  Laterality: Right;   PATELLA FRACTURE SURGERY     TUBAL LIGATION      Social History   Socioeconomic History   Marital status: Married    Spouse name: RKyung Rudd  Number of children: 2   Years of  education: Not on file   Highest education level: Not on file  Occupational History   Not on file  Tobacco Use   Smoking status: Never   Smokeless tobacco: Never  Vaping Use   Vaping Use: Never used  Substance and Sexual Activity   Alcohol use: No   Drug use: No   Sexual activity: Not on file  Other Topics Concern   Not on file  Social History Narrative   Not on file   Social Determinants of Health   Financial Resource Strain: Not on file  Food Insecurity: Not on file  Transportation Needs: Not on file  Physical Activity: Not on file  Stress: Not on file  Social Connections: Not on file  Intimate Partner Violence: Not on file    Outpatient Encounter Medications as of 01/30/2021  Medication Sig   atorvastatin (LIPITOR) 20 MG tablet Take 1 tablet (20 mg total) by mouth daily.   Vitamin D, Ergocalciferol, (DRISDOL) 1.25 MG (50000 UNIT) CAPS capsule Take 1 capsule (50,000 Units total) by mouth every 7 (seven) days.   [DISCONTINUED] omeprazole (PRILOSEC) 10 MG capsule Take by mouth. (Patient not taking: Reported on 01/30/2021)   No facility-administered encounter medications on file as of 01/30/2021.    No Known Allergies  Review of Systems  Constitutional:  Negative for activity change, appetite change, chills, fatigue and fever.  HENT: Negative.  Eyes: Negative.   Respiratory:  Negative for cough, chest tightness and shortness of breath.   Cardiovascular:  Negative for chest pain, palpitations and leg swelling.  Gastrointestinal:  Negative for abdominal pain, blood in stool, constipation, diarrhea, nausea and vomiting.  Endocrine: Negative.  Negative for polydipsia, polyphagia and polyuria.  Genitourinary:  Negative for decreased urine volume, dysuria, frequency and urgency.  Musculoskeletal:  Positive for myalgias (with statin). Negative for arthralgias.  Skin: Negative.   Allergic/Immunologic: Negative.   Neurological:  Negative for dizziness, tremors, seizures,  syncope, facial asymmetry, speech difficulty, weakness, light-headedness, numbness and headaches.  Hematological: Negative.   Psychiatric/Behavioral:  Negative for confusion, hallucinations, sleep disturbance and suicidal ideas.   All other systems reviewed and are negative.      Objective:  BP 118/77   Pulse 66   Temp 97.6 F (36.4 C)   Ht '5\' 2"'  (1.575 m)   Wt 164 lb (74.4 kg)   SpO2 98%   BMI 30.00 kg/m    Wt Readings from Last 3 Encounters:  01/30/21 164 lb (74.4 kg)  10/23/20 161 lb (73 kg)  11/03/14 165 lb (74.8 kg)    Physical Exam Vitals and nursing note reviewed.  Constitutional:      General: She is not in acute distress.    Appearance: Normal appearance. She is well-developed and well-groomed. She is obese. She is not ill-appearing, toxic-appearing or diaphoretic.  HENT:     Head: Normocephalic and atraumatic.     Jaw: There is normal jaw occlusion.     Right Ear: Hearing normal.     Left Ear: Hearing normal.     Nose: Nose normal.     Mouth/Throat:     Lips: Pink.     Mouth: Mucous membranes are moist.     Pharynx: Oropharynx is clear. Uvula midline.  Eyes:     General: Lids are normal.     Extraocular Movements: Extraocular movements intact.     Conjunctiva/sclera: Conjunctivae normal.     Pupils: Pupils are equal, round, and reactive to light.  Neck:     Thyroid: No thyroid mass, thyromegaly or thyroid tenderness.     Vascular: No carotid bruit or JVD.     Trachea: Trachea and phonation normal.  Cardiovascular:     Rate and Rhythm: Normal rate and regular rhythm.     Chest Wall: PMI is not displaced.     Pulses: Normal pulses.     Heart sounds: Normal heart sounds. No murmur heard.   No friction rub. No gallop.  Pulmonary:     Effort: Pulmonary effort is normal. No respiratory distress.     Breath sounds: Normal breath sounds. No wheezing.  Abdominal:     General: Bowel sounds are normal. There is no distension or abdominal bruit.      Palpations: Abdomen is soft. There is no hepatomegaly or splenomegaly.     Tenderness: There is no abdominal tenderness. There is no right CVA tenderness or left CVA tenderness.     Hernia: No hernia is present.  Musculoskeletal:        General: Normal range of motion.     Cervical back: Normal range of motion and neck supple.     Right lower leg: No edema.     Left lower leg: No edema.  Lymphadenopathy:     Cervical: No cervical adenopathy.  Skin:    General: Skin is warm and dry.     Capillary Refill: Capillary refill takes less  than 2 seconds.     Coloration: Skin is not cyanotic, jaundiced or pale.     Findings: No rash.  Neurological:     General: No focal deficit present.     Mental Status: She is alert and oriented to person, place, and time.     Cranial Nerves: No cranial nerve deficit.     Sensory: Sensation is intact.     Motor: Motor function is intact. No weakness.     Coordination: Coordination is intact.     Gait: Gait is intact. Gait normal.     Deep Tendon Reflexes: Reflexes are normal and symmetric.  Psychiatric:        Attention and Perception: Attention and perception normal.        Mood and Affect: Mood and affect normal.        Speech: Speech normal.        Behavior: Behavior normal. Behavior is cooperative.        Thought Content: Thought content normal.        Cognition and Memory: Cognition and memory normal.        Judgment: Judgment normal.    Results for orders placed or performed in visit on 10/23/20  CBC with Differential/Platelet  Result Value Ref Range   WBC 5.2 3.4 - 10.8 x10E3/uL   RBC 4.31 3.77 - 5.28 x10E6/uL   Hemoglobin 13.5 11.1 - 15.9 g/dL   Hematocrit 39.2 34.0 - 46.6 %   MCV 91 79 - 97 fL   MCH 31.3 26.6 - 33.0 pg   MCHC 34.4 31.5 - 35.7 g/dL   RDW 11.2 (L) 11.7 - 15.4 %   Platelets 275 150 - 450 x10E3/uL   Neutrophils 47 Not Estab. %   Lymphs 42 Not Estab. %   Monocytes 7 Not Estab. %   Eos 3 Not Estab. %   Basos 1 Not Estab.  %   Neutrophils Absolute 2.5 1.4 - 7.0 x10E3/uL   Lymphocytes Absolute 2.2 0.7 - 3.1 x10E3/uL   Monocytes Absolute 0.4 0.1 - 0.9 x10E3/uL   EOS (ABSOLUTE) 0.1 0.0 - 0.4 x10E3/uL   Basophils Absolute 0.0 0.0 - 0.2 x10E3/uL   Immature Granulocytes 0 Not Estab. %   Immature Grans (Abs) 0.0 0.0 - 0.1 x10E3/uL  CMP14+EGFR  Result Value Ref Range   Glucose 94 65 - 99 mg/dL   BUN 14 8 - 27 mg/dL   Creatinine, Ser 0.85 0.57 - 1.00 mg/dL   eGFR 78 >59 mL/min/1.73   BUN/Creatinine Ratio 16 12 - 28   Sodium 140 134 - 144 mmol/L   Potassium 4.1 3.5 - 5.2 mmol/L   Chloride 104 96 - 106 mmol/L   CO2 21 20 - 29 mmol/L   Calcium 9.1 8.7 - 10.3 mg/dL   Total Protein 7.3 6.0 - 8.5 g/dL   Albumin 4.6 3.8 - 4.8 g/dL   Globulin, Total 2.7 1.5 - 4.5 g/dL   Albumin/Globulin Ratio 1.7 1.2 - 2.2   Bilirubin Total 0.5 0.0 - 1.2 mg/dL   Alkaline Phosphatase 83 44 - 121 IU/L   AST 21 0 - 40 IU/L   ALT 22 0 - 32 IU/L  Lipid panel  Result Value Ref Range   Cholesterol, Total 260 (H) 100 - 199 mg/dL   Triglycerides 161 (H) 0 - 149 mg/dL   HDL 60 >39 mg/dL   VLDL Cholesterol Cal 29 5 - 40 mg/dL   LDL Chol Calc (NIH) 171 (H) 0 - 99 mg/dL  Chol/HDL Ratio 4.3 0.0 - 4.4 ratio  Thyroid Panel With TSH  Result Value Ref Range   TSH 2.120 0.450 - 4.500 uIU/mL   T4, Total 8.1 4.5 - 12.0 ug/dL   T3 Uptake Ratio 25 24 - 39 %   Free Thyroxine Index 2.0 1.2 - 4.9  VITAMIN D 25 Hydroxy (Vit-D Deficiency, Fractures)  Result Value Ref Range   Vit D, 25-Hydroxy 8.4 (L) 30.0 - 100.0 ng/mL  Hepatitis C antibody  Result Value Ref Range   Hep C Virus Ab <0.1 0.0 - 0.9 s/co ratio  HIV Antibody (routine testing w rflx)  Result Value Ref Range   HIV Screen 4th Generation wRfx Non Reactive Non Reactive  Bayer DCA Hb A1c Waived  Result Value Ref Range   HB A1C (BAYER DCA - WAIVED) 5.8 <7.0 %  Cytology - PAP([place])  Result Value Ref Range   High risk HPV Positive (A)    Adequacy      Satisfactory for  evaluation; transformation zone component ABSENT.   Diagnosis      - Negative for intraepithelial lesion or malignancy (NILM)   Comment Normal Reference Range HPV - Negative        Pertinent labs & imaging results that were available during my care of the patient were reviewed by me and considered in my medical decision making.  Assessment & Plan:  Diagnoses and all orders for this visit:  Prediabetes A1C 6.0 in office today. Diet and exercise discussed in detail. Recheck A1C in 6 months, if still elevated, will start medications.  -     Bayer DCA Hb A1c Waived  Vitamin D deficiency Labs pending. Pt on repletion therapy. No arthralgias reported. DEXA scan revealed osteopenia.  -     VITAMIN D 25 Hydroxy (Vit-D Deficiency, Fractures)  Osteopenia of multiple sites Pt taking calcium and Vit D as discussed. Weight bearing exercises discussed in detail.  Screening for colorectal cancer Due for colonoscopy. Will refer to GI for screening.  -     Ambulatory referral to Gastroenterology  Mixed hyperlipidemia Has been taking statin every other day due to myalgias at times. Diet and exercise encouraged. Will recheck lipids at next visit.   Lung nodules Will obtain follow up CT chest for evaluation.  -     CT CHEST NODULE FOLLOW UP LOW DOSE W/O; Future     Continue all other maintenance medications.  Follow up plan: Return in about 6 months (around 07/30/2021), or if symptoms worsen or fail to improve, for prediabetes, cholesterol.   Continue healthy lifestyle choices, including diet (rich in fruits, vegetables, and lean proteins, and low in salt and simple carbohydrates) and exercise (at least 30 minutes of moderate physical activity daily).  Educational handout given for osteopenia, DM  The above assessment and management plan was discussed with the patient. The patient verbalized understanding of and has agreed to the management plan. Patient is aware to call the clinic if they  develop any new symptoms or if symptoms persist or worsen. Patient is aware when to return to the clinic for a follow-up visit. Patient educated on when it is appropriate to go to the emergency department.   Monia Pouch, FNP-C [facility] [phone]

## 2021-01-30 NOTE — Patient Instructions (Addendum)
Your recent bone density scan demonstrated that you have osteopenia.  Osteopenia is when your bones become thinner and weaker.  This is not osteoporosis but can become osteoporosis over time.  Today, I provided you a handout reviewing calcium and vitamin D and ways to incorporate this in your diet.  You may need to take a supplement if you are unable to get sufficient amount of these minerals through food.  Strength training is just as important in maintaining bone health.  A copy of home exercises has been provided to you. You need to repeat your bone density in 2 years.   Exercise for Strong Bones  Exercise is important to build and maintain strong bones / bone density.  There are 2 types of exercises that are important to building and maintaining strong bones:  Weight- bearing and muscle-stregthening.  Weight-bearing Exercises  These exercises include activities that make you move against gravity while staying upright. Weight-bearing exercises can be high-impact or low-impact.  High-impact weight-bearing exercises help build bones and keep them strong. If you have broken a bone due to osteoporosis or are at risk of breaking a bone, you may need to avoid high-impact exercises. If you're not sure, you should check with your healthcare provider.  Examples of high-impact weight-bearing exercises are: Dancing  Doing high-impact aerobics  Hiking  Jogging/running  Jumping Rope  Stair climbing  Tennis  Low-impact weight-bearing exercises can also help keep bones strong and are a safe alternative if you cannot do high-impact exercises.   Examples of low-impact weight-bearing exercises are: Using elliptical training machines  Doing low-impact aerobics  Using stair-step machines  Fast walking on a treadmill or outside   Muscle-Strengthening Exercises These exercises include activities where you move your body, a weight or some other resistance against gravity. They are also known as resistance  exercises and include: Lifting weights  Using elastic exercise bands  Using weight machines  Lifting your own body weight  Functional movements, such as standing and rising up on your toes  Yoga and Pilates can also improve strength, balance and flexibility. However, certain positions may not be safe for people with osteoporosis or those at increased risk of broken bones. For example, exercises that have you bend forward may increase the chance of breaking a bone in the spine.   Non-Impact Exercises There are other types of exercises that can help prevent falls.  Non-impact exercises can help you to improve balance, posture and how well you move in everyday activities. Some of these exercises include: Balance exercises that strengthen your legs and test your balance, such as Tai Chi, can decrease your risk of falls.  Posture exercises that improve your posture and reduce rounded or "sloping" shoulders can help you decrease the chance of breaking a bone, especially in the spine.  Functional exercises that improve how well you move can help you with everyday activities and decrease your chance of falling and breaking a bone. For example, if you have trouble getting up from a chair or climbing stairs, you should do these activities as exercises.   A physical therapist can teach you balance, posture and functional exercises. He/she can also help you learn which exercises are safe and appropriate for you.  [facility] has a physical therapy office in [place] in front of our office and referrals can be made for assessments and treatment as needed and strength and balance training.  If you would like to have an assessment with [facility] and our physical therapy   team please let a nurse or provider know.  For more information go to the National Osteoporosis Foundation website: https://www.nof.org/.   Click the striped box in the right upper corner.   Then click through patient info in the left lower hand  corner to out more about osteoporosis and osteopenia and how you can prevent fractures.   Follow these instructions at home: Include calcium and vitamin D in your diet. Calcium is important for bone health, and vitamin D helps the body absorb calcium. Os-Cal is a great over the counter supplement to take daily. It has calcium and Vitamin D.  Perform weight-bearing and muscle-strengthening exercises as directed by your health care provider.  Do not use any tobacco products, including cigarettes, chewing tobacco, and electronic cigarettes. If you need help quitting, ask your health care provider. Limit your alcohol intake. Take medicines only as directed by your health care provider. Keep all follow-up visits as directed by your health care provider. This is important. Take precautions at home to lower your risk of falling, such as: Keeping rooms well lit and clutter free. Installing safety rails on stairs. Using rubber mats in the bathroom and other areas that are often wet or slippery.     Calcium & Vitamin D: The Facts  Why is calcium and vitamin D consumption important? Calcium: Most Americans do not consume adequate amounts of calcium! Calcium is required for proper muscle function, nerve communication, bone support, and many other functions in the body.  The body uses bones as a source of calcium. Bones 'remodel' themselves continuously - the body constantly breaks bone down to release calcium and rebuilds bones by replacing calcium in the bone later.  As we get older, the rate of bone breakdown occurs faster than bone rebuilding which could lead to osteopenia, osteoporosis, and possible fractures.   Vitamin D: People naturally make vitamin D in the body when sunlight hits the skin and triggers a process that leads to vitamin D production. This natural vitamin D production requires about 10-15 minutes of sun exposure on the hands, arms, and face at least 2-3 times per week. However,  due to decreased sun exposure and the use of sunscreen, most people will need to get additional vitamin D from foods or supplements. Your doctor can measure your body's vitamin D level through a simple blood test to determine your daily vitamin D needs.  Vitamin D is used to help the body absorb calcium, maintain bone health, help the immune system, and reduce inflammation. It also plays a role in muscle performance, balance and risk of falling.  Vitamin D deficiency can lead to osteomalacia or softening of the bones, bone pain, and muscle weakness.   The recommended daily allowance of Calcium and Vitamin D varies for different age groups. Age group Calcium (mg) Vitamin D (IU)  Females and Males: Age 19-50 1000 mg 600 IU  Females: Age 51- 70 1200 mg 600 IU  Males: Age 51-70 1000 mg 600 IU  Females and Males: Age 71+ 1200 mg 800 IU  Pregnant/lactating Females age 19-50 1000 mg 600 IU   How much Calcium do you get in your diet? Calcium Intake # of servings per day  Total calcium (mg)  Skim milk, 2% milk (1 cup) _________ x 300 mg   Yogurt (1 small container) _________ x 200 mg   Cheese (1oz) _________ x 200 mg   Cottage Cheese (1 cup)               ________ x 150 mg   Almond milk (1 cup) _________ x 450 mg   Fortified Orange Juice (1 cup) _________ x 300 mg   Broccoli or spinach ( 1 cup) _________ x 100 mg   Salmon (3 oz) _________ x 150 mg    Almonds (1/4 cup) _______ x 90 mg      How do we get Calcium and Vitamin D in our diet? Calcium: Obtaining calcium from the diet is the most preferred way to reach the recommended daily goal. If this goal is not reached through diet, calcium supplements are available.  Calcium is found in many foods including: dairy products, dark leafy vegetables (like broccoli, kale, and spinach), fish, and fortified products like juices and cereals.  The food label will have a %DV (percent daily value) listed showing the amount of calcium per serving. To determine  the total mg per serving, simply replace the % with zero (0).  For example, Almond Breeze almond milk contains 45% DV of calcium or $RemoveBe'450mg'ROBGWctcj$  per 1 cup.  You can increase the amount of calcium in your diet by using more calcium products in your daily meals. Use yogurt and fruit to make smoothies or use yogurt to top baked potatoes or make whipped potatoes. Sprinkle low fat cheese onto salads or into egg white omelets. You can even add non-fat dry milk powder ($RemoveBefo'300mg'GdftuToATaU$  calcium per 1/3 cup) to hot cereals, meat loaf, soups, or potatoes.  Calcium supplements come in many forms including tablets, chewables, and gummies. Be sure to read the label to determine the correct number of tablets per serving and whether or not to take the supplement with food.  Calcium carbonate products (Oscal, Caltrate, and Viactiv) are generally better absorbed when taken with food while calcium citrate products like Citracal can be taken with or without food.  The body can only absorb about 600 mg of calcium at one time. It is recommended to take calcium supplements in small amounts several times per day.  However, taking it all at once is better than not taking it at all. Increasing your intake of calcium is essential for bone health, but may also lead to some side effects like constipation, increased gas, bloating or abdominal cramping. To help reduce these side effects, start with 1 tablet per day and slowly increase your intake of the supplement to the recommended doses. It is also recommended that you drink plenty of water each day. Vitamin D: Very few foods naturally contain vitamin D. However, it is found in saltwater fish (like tuna, salmon and mackerel), beef liver, egg yolks, cheese and vitamin D fortified foods (like yogurt, cereals, orange juice and milk) The amount of vitamin D in each food or product is listed as %DV on the product label. To determine the total amount of vitamin D per serving, drop the % sign and multiply the  number by 4. For example, 1 cup of Almond Breeze almond milk contains 25% DV vitamin D or 100 IU per serving (25 x 4 =100). Vitamin D is also found in multivitamins and supplements and may be listed as ergocalciferol (vitamin D2) or cholecalciferol (vitamin D3). Each of these forms of vitamin D are equivalent and the daily recommended intake will vary based on your age and the vitamin D levels in your body. Follow your doctor's recommendation for vitamin D intake.   Continue to monitor your blood sugars as we discussed and record them. Bring the log to your next appointment.  Take your  medications as directed.    Goal Blood glucose:    Fasting (before meals) = 80 to 130   Within 2 hours of eating = less than 180   Understanding your Hemoglobin A1c:     Diabetes Mellitus and Nutrition    I think that you would greatly benefit from seeing a nutritionist. If this is something you are interested in, please call Dr Jenne Campus at [phone] to schedule an appointment.   When you have diabetes (diabetes mellitus), it is very important to have healthy eating habits because your blood sugar (glucose) levels are greatly affected by what you eat and drink. Eating healthy foods in the appropriate amounts, at about the same times every day, can help you: Control your blood glucose. Lower your risk of heart disease. Improve your blood pressure. Reach or maintain a healthy weight.  Every person with diabetes is different, and each person has different needs for a meal plan. Your health care provider may recommend that you work with a diet and nutrition specialist (dietitian) to make a meal plan that is best for you. Your meal plan may vary depending on factors such as: The calories you need. The medicines you take. Your weight. Your blood glucose, blood pressure, and cholesterol levels. Your activity level. Other health conditions you have, such as heart or kidney disease.  How do carbohydrates  affect me? Carbohydrates affect your blood glucose level more than any other type of food. Eating carbohydrates naturally increases the amount of glucose in your blood. Carbohydrate counting is a method for keeping track of how many carbohydrates you eat. Counting carbohydrates is important to keep your blood glucose at a healthy level, especially if you use insulin or take certain oral diabetes medicines. It is important to know how many carbohydrates you can safely have in each meal. This is different for every person. Your dietitian can help you calculate how many carbohydrates you should have at each meal and for snack. Foods that contain carbohydrates include: Bread, cereal, rice, pasta, and crackers. Potatoes and corn. Peas, beans, and lentils. Milk and yogurt. Fruit and juice. Desserts, such as cakes, cookies, ice cream, and candy.  How does alcohol affect me? Alcohol can cause a sudden decrease in blood glucose (hypoglycemia), especially if you use insulin or take certain oral diabetes medicines. Hypoglycemia can be a life-threatening condition. Symptoms of hypoglycemia (sleepiness, dizziness, and confusion) are similar to symptoms of having too much alcohol. If your health care provider says that alcohol is safe for you, follow these guidelines: Limit alcohol intake to no more than 1 drink per day for nonpregnant women and 2 drinks per day for men. One drink equals 12 oz of beer, 5 oz of wine, or 1 oz of hard liquor. Do not drink on an empty stomach. Keep yourself hydrated with water, diet soda, or unsweetened iced tea. Keep in mind that regular soda, juice, and other mixers may contain a lot of sugar and must be counted as carbohydrates.  What are tips for following this plan?  Reading food labels Start by checking the serving size on the label. The amount of calories, carbohydrates, fats, and other nutrients listed on the label are based on one serving of the food. Many foods  contain more than one serving per package. Check the total grams (g) of carbohydrates in one serving. You can calculate the number of servings of carbohydrates in one serving by dividing the total carbohydrates by 15. For example, if a food has  30 g of total carbohydrates, it would be equal to 2 servings of carbohydrates. Check the number of grams (g) of saturated and trans fats in one serving. Choose foods that have low or no amount of these fats. Check the number of milligrams (mg) of sodium in one serving. Most people should limit total sodium intake to less than 2,300 mg per day. Always check the nutrition information of foods labeled as "low-fat" or "nonfat". These foods may be higher in added sugar or refined carbohydrates and should be avoided. Talk to your dietitian to identify your daily goals for nutrients listed on the label.  Shopping Avoid buying canned, premade, or processed foods. These foods tend to be high in fat, sodium, and added sugar. Shop around the outside edge of the grocery store. This includes fresh fruits and vegetables, bulk grains, fresh meats, and fresh dairy.  Cooking Use low-heat cooking methods, such as baking, instead of high-heat cooking methods like deep frying. Cook using healthy oils, such as olive, canola, or sunflower oil. Avoid cooking with butter, cream, or high-fat meats.  Meal planning Eat meals and snacks regularly, preferably at the same times every day. Avoid going long periods of time without eating. Eat foods high in fiber, such as fresh fruits, vegetables, beans, and whole grains. Talk to your dietitian about how many servings of carbohydrates you can eat at each meal. Eat 4-6 ounces of lean protein each day, such as lean meat, chicken, fish, eggs, or tofu. 1 ounce is equal to 1 ounce of meat, chicken, or fish, 1 egg, or 1/4 cup of tofu. Eat some foods each day that contain healthy fats, such as avocado, nuts, seeds, and  fish.  Lifestyle  Check your blood glucose regularly. Exercise at least 30 minutes 5 or more days each week, or as told by your health care provider. Take medicines as told by your health care provider. Do not use any products that contain nicotine or tobacco, such as cigarettes and e-cigarettes. If you need help quitting, ask your health care provider. Work with a Social worker or diabetes educator to identify strategies to manage stress and any emotional and social challenges.  What are some questions to ask my health care provider? Do I need to meet with a diabetes educator? Do I need to meet with a dietitian? What number can I call if I have questions? When are the best times to check my blood glucose?  Where to find more information: American Diabetes Association: diabetes.org/food-and-fitness/food Academy of Nutrition and Dietetics: PokerClues.dk [employer] of Diabetes and Digestive and Kidney Diseases (NIH): ContactWire.be  Summary A healthy meal plan will help you control your blood glucose and maintain a healthy lifestyle. Working with a diet and nutrition specialist (dietitian) can help you make a meal plan that is best for you. Keep in mind that carbohydrates and alcohol have immediate effects on your blood glucose levels. It is important to count carbohydrates and to use alcohol carefully. This information is not intended to replace advice given to you by your health care provider. Make sure you discuss any questions you have with your health care provider. Document Released: 11/14/2004 Document Revised: 03/24/2016 Document Reviewed: 03/24/2016 Elsevier Interactive Patient Education  [employer].

## 2021-01-31 LAB — VITAMIN D 25 HYDROXY (VIT D DEFICIENCY, FRACTURES): Vit D, 25-Hydroxy: 48.6 ng/mL (ref 30.0–100.0)

## 2021-02-04 ENCOUNTER — Encounter: Payer: Self-pay | Admitting: Internal Medicine

## 2021-03-12 ENCOUNTER — Ambulatory Visit (HOSPITAL_COMMUNITY)
Admission: RE | Admit: 2021-03-12 | Discharge: 2021-03-12 | Disposition: A | Discharge status: Home / Self Care | Payer: 59 | Source: Ambulatory Visit | Attending: Family Medicine | Admitting: Family Medicine

## 2021-03-12 ENCOUNTER — Other Ambulatory Visit: Payer: Self-pay

## 2021-03-12 DIAGNOSIS — R918 Other nonspecific abnormal finding of lung field: Secondary | ICD-10-CM | POA: Diagnosis present

## 2021-03-20 ENCOUNTER — Ambulatory Visit (INDEPENDENT_AMBULATORY_CARE_PROVIDER_SITE_OTHER): Payer: Self-pay | Admitting: *Deleted

## 2021-03-20 ENCOUNTER — Other Ambulatory Visit: Payer: Self-pay

## 2021-03-20 VITALS — Ht 63.0 in | Wt 167.0 lb

## 2021-03-20 DIAGNOSIS — Z1211 Encounter for screening for malignant neoplasm of colon: Secondary | ICD-10-CM

## 2021-03-20 NOTE — Progress Notes (Addendum)
Gastroenterology Pre-Procedure Review  Request Date: 03/20/2021 Requesting Physician: Darla Lesches, [facility], Last TCS done 12/05/2011 by Dr. Britta Mccreedy in [place], normal colon  PATIENT REVIEW QUESTIONS: The patient responded to the following health history questions as indicated:    1. Diabetes Melitis: no, borderline but managed with diet and exercise 2. Joint replacements in the past 12 months: no 3. Major health problems in the past 3 months: no 4. Has an artificial valve or MVP: no 5. Has a defibrillator: no 6. Has been advised in past to take antibiotics in advance of a procedure like teeth cleaning: no 7. Family history of colon cancer: no 8. Alcohol Use: no 9. Illicit drug Use: no 10. History of sleep apnea: no  11. History of coronary artery or other vascular stents placed within the last 12 months: no 12. History of any prior anesthesia complications: no 13. Body mass index is 29.58 kg/m.    MEDICATIONS & ALLERGIES:    Patient reports the following regarding taking any blood thinners:   Plavix? no Aspirin? no Coumadin? no Brilinta? no Xarelto? no Eliquis? no Pradaxa? no Savaysa? no Effient? no  Patient confirms/reports the following medications:  Current Outpatient Medications  Medication Sig Dispense Refill   atorvastatin (LIPITOR) 20 MG tablet Take 1 tablet (20 mg total) by mouth daily. 90 tablet 3   Vitamin D, Ergocalciferol, (DRISDOL) 1.25 MG (50000 UNIT) CAPS capsule TAKE 1 CAPSULE (50,000 UNITS TOTAL) BY MOUTH EVERY 7 (SEVEN) DAYS 5 capsule 2   No current facility-administered medications for this visit.    Patient confirms/reports the following allergies:  No Known Allergies  No orders of the defined types were placed in this encounter.   AUTHORIZATION INFORMATION Primary Insurance: [employer],  ID #: [id number] Pre-Cert / Josem Kaufmann required: No, not required per Blima Rich Pre-Cert / Auth #: REF: Blima Rich  04/04/2021  SCHEDULE INFORMATION: Procedure has been scheduled as follows:  Date: 04/26/2021, Time: 1:15 Location: [facility] with Dr. Abbey Chatters  This Gastroenterology Pre-Precedure Review Form is being routed to the following provider(s): Roseanne Kaufman, NP

## 2021-04-03 ENCOUNTER — Encounter: Payer: Self-pay | Admitting: *Deleted

## 2021-04-03 MED ORDER — PEG 3350-KCL-NA BICARB-NACL 420 G PO SOLR: 4000.0000 mL | Freq: Once | ORAL | 0 refills | Status: AC

## 2021-04-03 NOTE — Progress Notes (Signed)
ASA 2. Appropriate.  ?

## 2021-04-03 NOTE — Addendum Note (Signed)
Addended by: Metro Kung on: 04/03/2021 02:54 PM   Modules accepted: Orders

## 2021-04-03 NOTE — Progress Notes (Signed)
Spoke to pt.  Scheduled procedure for 04/26/2021 with arrival at 11:45.  Reviewed prep instructions with pt by phone.  Pt made aware that I am mailing out information to her.  Confirmed mailing address with pt.

## 2021-04-04 ENCOUNTER — Other Ambulatory Visit: Payer: Self-pay | Admitting: *Deleted

## 2021-04-26 ENCOUNTER — Other Ambulatory Visit: Payer: Self-pay

## 2021-04-26 ENCOUNTER — Ambulatory Visit (HOSPITAL_BASED_OUTPATIENT_CLINIC_OR_DEPARTMENT_OTHER): Payer: 59 | Admitting: Anesthesiology

## 2021-04-26 ENCOUNTER — Encounter (HOSPITAL_COMMUNITY): Payer: Self-pay

## 2021-04-26 ENCOUNTER — Ambulatory Visit (HOSPITAL_COMMUNITY): Payer: 59 | Admitting: Anesthesiology

## 2021-04-26 ENCOUNTER — Encounter (HOSPITAL_COMMUNITY)
Admission: RE | Disposition: A | Discharge status: Home / Self Care | Payer: Self-pay | Source: Home / Self Care | Attending: Internal Medicine

## 2021-04-26 ENCOUNTER — Ambulatory Visit (HOSPITAL_COMMUNITY)
Admission: RE | Admit: 2021-04-26 | Discharge: 2021-04-26 | Disposition: A | Discharge status: Home / Self Care | Payer: 59 | Attending: Internal Medicine | Admitting: Internal Medicine

## 2021-04-26 DIAGNOSIS — D124 Benign neoplasm of descending colon: Secondary | ICD-10-CM

## 2021-04-26 DIAGNOSIS — Z1211 Encounter for screening for malignant neoplasm of colon: Secondary | ICD-10-CM

## 2021-04-26 DIAGNOSIS — K5289 Other specified noninfective gastroenteritis and colitis: Secondary | ICD-10-CM | POA: Insufficient documentation

## 2021-04-26 DIAGNOSIS — K219 Gastro-esophageal reflux disease without esophagitis: Secondary | ICD-10-CM | POA: Insufficient documentation

## 2021-04-26 DIAGNOSIS — K648 Other hemorrhoids: Secondary | ICD-10-CM | POA: Insufficient documentation

## 2021-04-26 DIAGNOSIS — D125 Benign neoplasm of sigmoid colon: Secondary | ICD-10-CM

## 2021-04-26 DIAGNOSIS — K635 Polyp of colon: Secondary | ICD-10-CM

## 2021-04-26 HISTORY — PX: BIOPSY: SHX5522

## 2021-04-26 HISTORY — PX: COLONOSCOPY WITH PROPOFOL: SHX5780

## 2021-04-26 SURGERY — COLONOSCOPY WITH PROPOFOL
Anesthesia: General

## 2021-04-26 MED ORDER — LIDOCAINE HCL (CARDIAC) PF 100 MG/5ML IV SOSY
PREFILLED_SYRINGE | INTRAVENOUS | Status: DC | PRN
Start: 2021-04-26 — End: 2021-04-26
  Administered 2021-04-26: 50 mg via INTRAVENOUS

## 2021-04-26 MED ORDER — LACTATED RINGERS IV SOLN: INTRAVENOUS | Status: DC

## 2021-04-26 MED ORDER — PROPOFOL 10 MG/ML IV BOLUS
INTRAVENOUS | Status: DC | PRN
  Administered 2021-04-26: 100 mg via INTRAVENOUS

## 2021-04-26 MED ORDER — PHENYLEPHRINE HCL (PRESSORS) 10 MG/ML IV SOLN
INTRAVENOUS | Status: DC | PRN
  Administered 2021-04-26 (×2): 80 ug via INTRAVENOUS

## 2021-04-26 MED ORDER — PROPOFOL 500 MG/50ML IV EMUL
INTRAVENOUS | Status: DC | PRN
  Administered 2021-04-26: 150 ug/kg/min via INTRAVENOUS

## 2021-04-26 NOTE — H&P (Signed)
Primary Care Physician:  Baruch Gouty, FNP Primary Gastroenterologist:  Dr. Abbey Chatters  Pre-Procedure History & Physical: HPI:  Alexis Sandoval is a 61 y.o. female is here for a colonoscopy for colon cancer screening purposes.  Patient denies any family history of colorectal cancer.  No melena or hematochezia.  No abdominal pain or unintentional weight loss.  No change in bowel habits.  Overall feels well from a GI standpoint.  Past Medical History:  Diagnosis Date   GERD (gastroesophageal reflux disease)    does not take anything for it    Past Surgical History:  Procedure Laterality Date   ORIF WRIST FRACTURE Right 11/04/2014   Procedure: OPEN REDUCTION INTERNAL FIXATION (ORIF) WRIST FRACTURE;  Surgeon: Roseanne Kaufman, MD;  Location: [facility];  Service: Orthopedics;  Laterality: Right;   PATELLA FRACTURE SURGERY     TUBAL LIGATION      Prior to Admission medications   Medication Sig Start Date End Date Taking? Authorizing Provider  atorvastatin (LIPITOR) 20 MG tablet Take 1 tablet (20 mg total) by mouth daily. 10/24/20  Yes Rakes, Connye Burkitt, FNP  Vitamin D, Ergocalciferol, (DRISDOL) 1.25 MG (50000 UNIT) CAPS capsule TAKE 1 CAPSULE (50,000 UNITS TOTAL) BY MOUTH EVERY 7 (SEVEN) DAYS Patient taking differently: Take 50,000 Units by mouth every Wednesday. 01/31/21  Yes RakesConnye Burkitt, FNP    Allergies as of 04/03/2021   (No Known Allergies)    Family History  Problem Relation Age of Onset   Hypertension Mother    Hyperlipidemia Mother    Diabetes Mother    Heart attack Father     Social History   Socioeconomic History   Marital status: Married    Spouse name: Kyung Rudd   Number of children: 2   Years of education: Not on file   Highest education level: Not on file  Occupational History   Not on file  Tobacco Use   Smoking status: Never   Smokeless tobacco: Never  Vaping Use   Vaping Use: Never used  Substance and Sexual Activity   Alcohol use: No   Drug use: No   Sexual  activity: Not on file  Other Topics Concern   Not on file  Social History Narrative   Not on file   Social Determinants of Health   Financial Resource Strain: Not on file  Food Insecurity: Not on file  Transportation Needs: Not on file  Physical Activity: Not on file  Stress: Not on file  Social Connections: Not on file  Intimate Partner Violence: Not on file    Review of Systems: See HPI, otherwise negative ROS  Physical Exam: Vital signs in last 24 hours: Weight:  [75.8 kg] 75.8 kg (02/24 1112)   General:   Alert,  Well-developed, well-nourished, pleasant and cooperative in NAD Head:  Normocephalic and atraumatic. Eyes:  Sclera clear, no icterus.   Conjunctiva pink. Ears:  Normal auditory acuity. Nose:  No deformity, discharge,  or lesions. Mouth:  No deformity or lesions, dentition normal. Neck:  Supple; no masses or thyromegaly. Lungs:  Clear throughout to auscultation.   No wheezes, crackles, or rhonchi. No acute distress. Heart:  Regular rate and rhythm; no murmurs, clicks, rubs,  or gallops. Abdomen:  Soft, nontender and nondistended. No masses, hepatosplenomegaly or hernias noted. Normal bowel sounds, without guarding, and without rebound.   Msk:  Symmetrical without gross deformities. Normal posture. Extremities:  Without clubbing or edema. Neurologic:  Alert and  oriented x4;  grossly normal neurologically. Skin:  Intact without significant lesions or rashes. Cervical Nodes:  No significant cervical adenopathy. Psych:  Alert and cooperative. Normal mood and affect.  Impression/Plan: Alexis Sandoval is here for a colonoscopy to be performed for colon cancer screening purposes.  The risks of the procedure including infection, bleed, or perforation as well as benefits, limitations, alternatives and imponderables have been reviewed with the patient. Questions have been answered. All parties agreeable.

## 2021-04-26 NOTE — Transfer of Care (Signed)
Immediate Anesthesia Transfer of Care Note  Patient: Alexis Sandoval  Procedure(s) Performed: COLONOSCOPY WITH PROPOFOL BIOPSY  Patient Location: Endoscopy Unit  Anesthesia Type:General  Level of Consciousness: awake  Airway & Oxygen Therapy: Patient Spontanous Breathing  Post-op Assessment: Report given to RN and Post -op Vital signs reviewed and stable  Post vital signs: Reviewed and stable  Last Vitals:  Vitals Value Taken Time  BP    Temp    Pulse    Resp    SpO2      Last Pain:  Vitals:   04/26/21 1137  TempSrc:   PainSc: 0-No pain      Patients Stated Pain Goal: 10 (25/18/98 4210)  Complications: No notable events documented.

## 2021-04-26 NOTE — Anesthesia Preprocedure Evaluation (Addendum)
Anesthesia Evaluation  Patient identified by MRN, date of birth, ID band Patient awake    Reviewed: Allergy & Precautions, NPO status , Patient's Chart, lab work & pertinent test results  Airway Mallampati: II  TM Distance: >3 FB Neck ROM: Full    Dental  (+) Dental Advisory Given Fillings :   Pulmonary neg pulmonary ROS,    Pulmonary exam normal breath sounds clear to auscultation       Cardiovascular Exercise Tolerance: Good negative cardio ROS Normal cardiovascular exam Rhythm:Regular Rate:Normal     Neuro/Psych negative neurological ROS  negative psych ROS   GI/Hepatic Neg liver ROS, GERD  Controlled,  Endo/Other  negative endocrine ROS  Renal/GU negative Renal ROS  negative genitourinary   Musculoskeletal negative musculoskeletal ROS (+)   Abdominal   Peds negative pediatric ROS (+)  Hematology negative hematology ROS (+)   Anesthesia Other Findings   Reproductive/Obstetrics negative OB ROS                            Anesthesia Physical Anesthesia Plan  ASA: 2  Anesthesia Plan: General   Post-op Pain Management: Minimal or no pain anticipated   Induction: Intravenous  PONV Risk Score and Plan: TIVA  Airway Management Planned: Nasal Cannula and Natural Airway  Additional Equipment:   Intra-op Plan:   Post-operative Plan:   Informed Consent: I have reviewed the patients History and Physical, chart, labs and discussed the procedure including the risks, benefits and alternatives for the proposed anesthesia with the patient or authorized representative who has indicated his/her understanding and acceptance.     Dental advisory given  Plan Discussed with: CRNA and Surgeon  Anesthesia Plan Comments:         Anesthesia Quick Evaluation

## 2021-04-26 NOTE — Anesthesia Postprocedure Evaluation (Signed)
Anesthesia Post Note  Patient: Alexis Sandoval  Procedure(s) Performed: COLONOSCOPY WITH PROPOFOL BIOPSY  Patient location during evaluation: Endoscopy Anesthesia Type: General Level of consciousness: awake and alert and oriented Pain management: pain level controlled Vital Signs Assessment: post-procedure vital signs reviewed and stable Respiratory status: spontaneous breathing, nonlabored ventilation and respiratory function stable Cardiovascular status: blood pressure returned to baseline and stable Postop Assessment: no apparent nausea or vomiting Anesthetic complications: no   No notable events documented.   Last Vitals:  Vitals:   04/26/21 1124 04/26/21 1159  BP: 125/78 (!) 102/58  Pulse: 71   Resp: 19 (!) 23  Temp: 37.2 C 36.9 C  SpO2: 98% 100%    Last Pain:  Vitals:   04/26/21 1159  TempSrc: Axillary  PainSc: 0-No pain                 Laelle Bridgett C Subhan Hoopes

## 2021-04-26 NOTE — Discharge Instructions (Addendum)
°  Colonoscopy Discharge Instructions  Read the instructions outlined below and refer to this sheet in the next few weeks. These discharge instructions provide you with general information on caring for yourself after you leave the hospital. Your doctor may also give you specific instructions. While your treatment has been planned according to the most current medical practices available, unavoidable complications occasionally occur.   ACTIVITY You may resume your regular activity, but move at a slower pace for the next 24 hours.  Take frequent rest periods for the next 24 hours.  Walking will help get rid of the air and reduce the bloated feeling in your belly (abdomen).  No driving for 24 hours (because of the medicine (anesthesia) used during the test).   Do not sign any important legal documents or operate any machinery for 24 hours (because of the anesthesia used during the test).  NUTRITION Drink plenty of fluids.  You may resume your normal diet as instructed by your doctor.  Begin with a light meal and progress to your normal diet. Heavy or fried foods are harder to digest and may make you feel sick to your stomach (nauseated).  Avoid alcoholic beverages for 24 hours or as instructed.  MEDICATIONS You may resume your normal medications unless your doctor tells you otherwise.  WHAT YOU CAN EXPECT TODAY Some feelings of bloating in the abdomen.  Passage of more gas than usual.  Spotting of blood in your stool or on the toilet paper.  IF YOU HAD POLYPS REMOVED DURING THE COLONOSCOPY: No aspirin products for 7 days or as instructed.  No alcohol for 7 days or as instructed.  Eat a soft diet for the next 24 hours.  FINDING OUT THE RESULTS OF YOUR TEST Not all test results are available during your visit. If your test results are not back during the visit, make an appointment with your caregiver to find out the results. Do not assume everything is normal if you have not heard from your  caregiver or the medical facility. It is important for you to follow up on all of your test results.  SEEK IMMEDIATE MEDICAL ATTENTION IF: You have more than a spotting of blood in your stool.  Your belly is swollen (abdominal distention).  You are nauseated or vomiting.  You have a temperature over 101.  You have abdominal pain or discomfort that is severe or gets worse throughout the day.   Your colonoscopy revealed 2 polyp(s) which I removed successfully. Await pathology results, my office will contact you. I recommend repeating colonoscopy in 5 years for surveillance purposes.  The valve that connects your small bowel to your colon called the ileocecal valve did appear slightly abnormal.  Probably what your previous colonoscopist was referring to.  I did biopsy this today.   I hope you have a great rest of your week!  Elon Alas. Abbey Chatters, D.O. Gastroenterology and Hepatology [facility]

## 2021-04-26 NOTE — Op Note (Addendum)
[facility] Patient Name: Alexis Sandoval Procedure Date: 04/26/2021 11:25 AM MRN: [id number] Date of Birth: 10/10/1960 Attending MD: Elon Alas. Abbey Chatters DO CSN: [id number] Age: 61 Admit Type: Outpatient Procedure:                Colonoscopy Indications:              Screening for colorectal malignant neoplasm Providers:                Elon Alas. Abbey Chatters, DO, Charlsie Quest. Theda Sers RN, RN,                            Wynonia Musty Tech, Technician Referring MD:              Medicines:                See the Anesthesia note for documentation of the                            administered medications Complications:            No immediate complications. Estimated Blood Loss:     Estimated blood loss was minimal. Procedure:                Pre-Anesthesia Assessment:                           - The anesthesia plan was to use monitored                            anesthesia care (MAC).                           After obtaining informed consent, the colonoscope                            was passed under direct vision. Throughout the                            procedure, the patient's blood pressure, pulse, and                            oxygen saturations were monitored continuously. The                            [id number]-HQ190L ([id number]) scope was introduced through                            the anus and advanced to the the cecum, identified                            by appendiceal orifice and ileocecal valve. The                            colonoscopy was performed without difficulty. The                            patient tolerated the  procedure well. The quality                            of the bowel preparation was evaluated using the                            BBPS [facility] Bowel Preparation Scale) with scores                            of: Right Colon = 3, Transverse Colon = 3 and Left                            Colon = 3 (entire mucosa seen well with no residual                             staining, small fragments of stool or opaque                            liquid). The total BBPS score equals 9. Scope In: 11:40:25 AM Scope Out: 11:56:26 AM Scope Withdrawal Time: 0 hours 13 minutes 3 seconds  Total Procedure Duration: 0 hours 16 minutes 1 second  Findings:      The perianal and digital rectal examinations were normal.      Non-bleeding internal hemorrhoids were found during endoscopy.      An area of white speckled mucosa was found at the ileocecal valve.       Biopsies were taken with a cold forceps for histology.      A 2 mm polyp was found in the descending colon. The polyp was sessile.       The polyp was removed with a cold biopsy forceps. Resection and       retrieval were complete.      A 5 mm polyp was found in the sigmoid colon. The polyp was sessile. The       polyp was removed with a cold snare. Resection and retrieval were       complete.      The exam was otherwise without abnormality. Impression:               - Non-bleeding internal hemorrhoids.                           - Whit especkled mucosa at the ileocecal valve.                            Biopsied.                           - One 2 mm polyp in the descending colon, removed                            with a cold biopsy forceps. Resected and retrieved.                           - One 5 mm polyp in the sigmoid colon, removed with  a cold snare. Resected and retrieved.                           - The examination was otherwise normal. Moderate Sedation:      Per Anesthesia Care Recommendation:           - Patient has a contact number available for                            emergencies. The signs and symptoms of potential                            delayed complications were discussed with the                            patient. Return to normal activities tomorrow.                            Written discharge instructions were provided to the                             patient.                           - Resume previous diet.                           - Continue present medications.                           - Await pathology results.                           - Repeat colonoscopy in 5 years for surveillance.                           - Return to GI clinic PRN. Procedure Code(s):        --- Professional ---                           [phone], Colonoscopy, flexible; with removal of                            tumor(s), polyp(s), or other lesion(s) by snare                            technique                           45380, 6, Colonoscopy, flexible; with biopsy,                            single or multiple Diagnosis Code(s):        --- Professional ---                           Z12.11, Encounter for screening for malignant  neoplasm of colon                           K64.8, Other hemorrhoids                           K63.5, Polyp of colon CPT copyright 2019 American Medical Association. All rights reserved. The codes documented in this report are preliminary and upon coder review may  be revised to meet current compliance requirements. Elon Alas. Abbey Chatters, DO [place] Abbey Chatters, DO 04/26/2021 11:59:22 AM This report has been signed electronically. Number of Addenda: 0

## 2021-04-29 LAB — SURGICAL PATHOLOGY

## 2021-05-01 ENCOUNTER — Encounter (HOSPITAL_COMMUNITY): Payer: Self-pay | Admitting: Internal Medicine

## 2021-07-30 ENCOUNTER — Encounter: Payer: Self-pay | Admitting: Family Medicine

## 2021-07-30 ENCOUNTER — Ambulatory Visit (INDEPENDENT_AMBULATORY_CARE_PROVIDER_SITE_OTHER): Payer: 59 | Admitting: Family Medicine

## 2021-07-30 VITALS — BP 126/68 | HR 73 | Temp 98.5°F | Ht 63.0 in | Wt 168.0 lb

## 2021-07-30 DIAGNOSIS — Z6829 Body mass index (BMI) 29.0-29.9, adult: Secondary | ICD-10-CM

## 2021-07-30 DIAGNOSIS — E782 Mixed hyperlipidemia: Secondary | ICD-10-CM | POA: Diagnosis not present

## 2021-07-30 DIAGNOSIS — E559 Vitamin D deficiency, unspecified: Secondary | ICD-10-CM | POA: Diagnosis not present

## 2021-07-30 DIAGNOSIS — M8589 Other specified disorders of bone density and structure, multiple sites: Secondary | ICD-10-CM

## 2021-07-30 DIAGNOSIS — R7303 Prediabetes: Secondary | ICD-10-CM | POA: Diagnosis not present

## 2021-07-30 LAB — BAYER DCA HB A1C WAIVED: HB A1C (BAYER DCA - WAIVED): 5.7 % — ABNORMAL HIGH (ref 4.8–5.6)

## 2021-07-30 MED ORDER — VITAMIN D (ERGOCALCIFEROL) 1.25 MG (50000 UNIT) PO CAPS: 50000.0000 [IU] | ORAL_CAPSULE | ORAL | 2 refills | Status: DC

## 2021-07-30 NOTE — Progress Notes (Signed)
Subjective:  Patient ID: Alexis Sandoval, female    DOB: 12/26/1960, 61 y.o.   MRN: [id number]  Patient Care Team: Baruch Gouty, FNP as PCP - General (Family Medicine)   Chief Complaint:  Medical Management of Chronic Issues   HPI: Alexis Sandoval is a 61 y.o. female presenting on 07/30/2021 for Medical Management of Chronic Issues   1. Prediabetes Has been doing very well with diet and exercise. Last A1C 6.0. Denies polyuria, polyphagia, or polydipsia.   2. Mixed hyperlipidemia Has been doing well with diet and exercise. Currently not on medications.   3. BMI 29.0-29.9,adult Has been doing very well with diet and exercise. Has lost a few pounds since last visit.   4. Vitamin D deficiency On repletion therapy and tolerating well. No arthralgias, trouble walking, or recent fractures.   5. Osteopenia of multiple sites Last DEXA 10/2020. It taking calcium and Vit D repletion therapy. No recent fractures.      Relevant past medical, surgical, family, and social history reviewed and updated as indicated.  Allergies and medications reviewed and updated. Data reviewed: Chart in Epic.   Past Medical History:  Diagnosis Date   GERD (gastroesophageal reflux disease)    does not take anything for it    Past Surgical History:  Procedure Laterality Date   BIOPSY  04/26/2021   Procedure: BIOPSY;  Surgeon: Eloise Harman, DO;  Location: [facility];  Service: Endoscopy;;   COLONOSCOPY WITH PROPOFOL N/A 04/26/2021   Procedure: COLONOSCOPY WITH PROPOFOL;  Surgeon: Eloise Harman, DO;  Location: [facility];  Service: Endoscopy;  Laterality: N/A;  1:15 / ASA 2   ORIF WRIST FRACTURE Right 11/04/2014   Procedure: OPEN REDUCTION INTERNAL FIXATION (ORIF) WRIST FRACTURE;  Surgeon: Roseanne Kaufman, MD;  Location: [facility];  Service: Orthopedics;  Laterality: Right;   PATELLA FRACTURE SURGERY     TUBAL LIGATION      Social History   Socioeconomic History   Marital status:  Married    Spouse name: Kyung Rudd   Number of children: 2   Years of education: Not on file   Highest education level: Not on file  Occupational History   Not on file  Tobacco Use   Smoking status: Never   Smokeless tobacco: Never  Vaping Use   Vaping Use: Never used  Substance and Sexual Activity   Alcohol use: No   Drug use: No   Sexual activity: Not on file  Other Topics Concern   Not on file  Social History Narrative   Not on file   Social Determinants of Health   Financial Resource Strain: Not on file  Food Insecurity: Not on file  Transportation Needs: Not on file  Physical Activity: Not on file  Stress: Not on file  Social Connections: Not on file  Intimate Partner Violence: Not on file    Outpatient Encounter Medications as of 07/30/2021  Medication Sig   atorvastatin (LIPITOR) 20 MG tablet Take 1 tablet (20 mg total) by mouth daily.   Cyanocobalamin (VITAMIN B 12 PO) Take by mouth.   [DISCONTINUED] Vitamin D, Ergocalciferol, (DRISDOL) 1.25 MG (50000 UNIT) CAPS capsule TAKE 1 CAPSULE (50,000 UNITS TOTAL) BY MOUTH EVERY 7 (SEVEN) DAYS (Patient taking differently: Take 50,000 Units by mouth every Wednesday.)   polyethylene glycol-electrolytes (NULYTELY) 420 g solution SMARTSIG:4000 Milliliter(s) By Mouth Daily (Patient not taking: Reported on 07/30/2021)   Vitamin D, Ergocalciferol, (DRISDOL) 1.25 MG (50000 UNIT) CAPS capsule Take  1 capsule (50,000 Units total) by mouth every 7 (seven) days.   No facility-administered encounter medications on file as of 07/30/2021.    No Known Allergies  Review of Systems  Constitutional:  Negative for activity change, appetite change, chills, diaphoresis, fatigue, fever and unexpected weight change.  HENT: Negative.    Eyes: Negative.  Negative for photophobia and visual disturbance.  Respiratory:  Negative for cough, chest tightness and shortness of breath.   Cardiovascular:  Negative for chest pain, palpitations and leg swelling.   Gastrointestinal:  Negative for abdominal pain, blood in stool, constipation, diarrhea, nausea and vomiting.  Endocrine: Negative.  Negative for cold intolerance, heat intolerance, polydipsia, polyphagia and polyuria.  Genitourinary:  Negative for decreased urine volume, difficulty urinating, dysuria, frequency and urgency.  Musculoskeletal:  Negative for arthralgias and myalgias.  Skin: Negative.   Allergic/Immunologic: Negative.   Neurological:  Negative for dizziness, weakness and headaches.  Hematological: Negative.   Psychiatric/Behavioral:  Negative for confusion, hallucinations, sleep disturbance and suicidal ideas.   All other systems reviewed and are negative.      Objective:  BP 126/68   Pulse 73   Temp 98.5 F (36.9 C)   Ht '5\' 3"'  (1.6 m)   Wt 168 lb (76.2 kg)   SpO2 96%   BMI 29.76 kg/m    Wt Readings from Last 3 Encounters:  07/30/21 168 lb (76.2 kg)  04/26/21 167 lb (75.8 kg)  03/20/21 167 lb (75.8 kg)    Physical Exam Vitals and nursing note reviewed.  Constitutional:      General: She is not in acute distress.    Appearance: Normal appearance. She is overweight. She is not ill-appearing, toxic-appearing or diaphoretic.  HENT:     Head: Normocephalic and atraumatic.     Mouth/Throat:     Mouth: Mucous membranes are moist.  Eyes:     Pupils: Pupils are equal, round, and reactive to light.  Cardiovascular:     Rate and Rhythm: Normal rate and regular rhythm.     Heart sounds: Normal heart sounds. No murmur heard.   No friction rub. No gallop.  Pulmonary:     Effort: Pulmonary effort is normal.     Breath sounds: Normal breath sounds.  Musculoskeletal:     Right lower leg: No edema.     Left lower leg: No edema.  Skin:    General: Skin is warm and dry.     Capillary Refill: Capillary refill takes less than 2 seconds.  Neurological:     General: No focal deficit present.     Mental Status: She is alert and oriented to person, place, and time.   Psychiatric:        Mood and Affect: Mood normal.        Behavior: Behavior normal.        Thought Content: Thought content normal.        Judgment: Judgment normal.    Results for orders placed or performed during the hospital encounter of 04/26/21  Surgical pathology  Result Value Ref Range   SURGICAL PATHOLOGY      SURGICAL PATHOLOGY CASE: [id number] PATIENT: Alexis Sandoval Surgical Pathology Report     Clinical History: screening     FINAL MICROSCOPIC DIAGNOSIS:  A. ILEOCECAL VALVE, BIOPSY: - Mild acute/subacute nonspecific enteritis, see comment - Negative for granulomas or dysplasia  B. COLON, DESCENDING, SIGMOID, POLYPECTOMY: - Tubular adenoma without high-grade dysplasia or malignancy - Serrated polyp with features of a sessile serrated polyp  COMMENT:  A.  Definite features of chronicity or inflammatory bowel disease are not seen.  Differential diagnosis mainly includes infection and drug-effect.    GROSS DESCRIPTION:  A: Received in formalin are tan, soft tissue fragments that are submitted in toto. Number: Multiple size: Range from 0.2 to 0.4 cm blocks: 1  B: Received in formalin are tan, soft tissue fragments that are submitted in toto. Number: 2 size: 0.4 and 0.6 cm blocks: 1 Craig Staggers 04/26/2021)    Final Diagnosis performed by Altha Harm, MD.   Electronically signed 04/29/2021 Technical component performed at [facility], [place] [address] [place], [place] [address].  Professional component performed at [facility], [place] [address] [place], [place] [address].  Immunohistochemistry Technical component (if applicable) was performed at [facility]. [address], [place], [place], [place] [address].   IMMUNOHISTOCHEMISTRY DISCLAIMER (if applicable): Some of these immunohistochemical stains may have been developed and the performance characteristics determine by [facility]. Some may not have been cleared or approved by the U.S. Food and Drug Administration. The FDA has determined that such clearance or approval is not necessary. This test is used for clinical purposes. It should not be regarded as investigational or for research. This laboratory is certified under the [place] (CLIA-88) as qu alified to perform high complexity clinical laboratory testing.  The controls stained appropriately.        Pertinent labs & imaging results that were available during my care of the patient were reviewed by me and considered in my medical decision making.  Assessment & Plan:  Alexis Sandoval was seen today for medical management of chronic issues.  Diagnoses and all orders for this visit:  Prediabetes A1C 5.7. Keep up the good work with diet and exercise. Will repeat in 6 months. Other labs pending.  -     BMP8+EGFR -     CBC with Differential/Platelet -     Lipid panel -     Bayer DCA Hb A1c Waived  Mixed hyperlipidemia Diet encouraged - increase intake of fresh fruits and vegetables, increase intake of lean proteins. Bake, broil, or grill foods. Avoid fried, greasy, and fatty foods. Avoid fast foods. Increase intake of fiber-rich whole grains. Exercise encouraged - at least 150 minutes per week and advance as tolerated. Goal BMI < 25. Continue medications as prescribed. Follow up in 3-6 months as discussed.  -     Lipid panel  BMI 29.0-29.9,adult Diet and exercise encouraged. Labs pending.  -     BMP8+EGFR -     CBC with Differential/Platelet -     Lipid panel -     Bayer DCA Hb A1c Waived  Vitamin D deficiency Labs pending. Continue repletion therapy. If indicated, will change repletion dosage. Eat foods rich in Vit D including milk, orange juice, yogurt with vitamin D added, salmon or mackerel, canned tuna fish, cereals with vitamin D added, and cod liver oil. Get out in the sun but make sure to wear at  least SPF 30 sunscreen.  -     VITAMIN D 25 Hydroxy (Vit-D Deficiency, Fractures) -     Vitamin D, Ergocalciferol, (DRISDOL) 1.25 MG (50000 UNIT) CAPS capsule; Take 1 capsule (50,000 Units total) by mouth every 7 (seven) days.  Osteopenia of multiple sites Labs pending. Will repeat DEXA scan in 2024.  -     BMP8+EGFR -     VITAMIN D 25 Hydroxy (Vit-D Deficiency,  Fractures)     Continue all other maintenance medications.  Follow up plan: Return in about 6 months (around 01/30/2022), or if symptoms worsen or fail to improve.   Continue healthy lifestyle choices, including diet (rich in fruits, vegetables, and lean proteins, and low in salt and simple carbohydrates) and exercise (at least 30 minutes of moderate physical activity daily).  Educational handout given for health maintenance  The above assessment and management plan was discussed with the patient. The patient verbalized understanding of and has agreed to the management plan. Patient is aware to call the clinic if they develop any new symptoms or if symptoms persist or worsen. Patient is aware when to return to the clinic for a follow-up visit. Patient educated on when it is appropriate to go to the emergency department.   Monia Pouch, FNP-C [facility] [phone]

## 2021-07-31 LAB — LIPID PANEL
Chol/HDL Ratio: 3.2 ratio (ref 0.0–4.4)
Cholesterol, Total: 178 mg/dL (ref 100–199)
HDL: 56 mg/dL (ref >39)
LDL Chol Calc (NIH): 92 mg/dL (ref 0–99)
Triglycerides: 177 mg/dL — ABNORMAL HIGH (ref 0–149)
VLDL Cholesterol Cal: 30 mg/dL (ref 5–40)

## 2021-07-31 LAB — CBC WITH DIFFERENTIAL/PLATELET
Basophils Absolute: 0.1 10*3/uL (ref 0.0–0.2)
Basos: 1 %
EOS (ABSOLUTE): 0.1 10*3/uL (ref 0.0–0.4)
Eos: 2 %
Hematocrit: 38.6 % (ref 34.0–46.6)
Hemoglobin: 13.6 g/dL (ref 11.1–15.9)
Immature Grans (Abs): 0 10*3/uL (ref 0.0–0.1)
Immature Granulocytes: 0 %
Lymphocytes Absolute: 2.2 10*3/uL (ref 0.7–3.1)
Lymphs: 42 %
MCH: 31.1 pg (ref 26.6–33.0)
MCHC: 35.2 g/dL (ref 31.5–35.7)
MCV: 88 fL (ref 79–97)
Monocytes Absolute: 0.4 10*3/uL (ref 0.1–0.9)
Monocytes: 8 %
Neutrophils Absolute: 2.5 10*3/uL (ref 1.4–7.0)
Neutrophils: 47 %
Platelets: 296 10*3/uL (ref 150–450)
RBC: 4.37 x10E6/uL (ref 3.77–5.28)
RDW: 11.6 % — ABNORMAL LOW (ref 11.7–15.4)
WBC: 5.2 10*3/uL (ref 3.4–10.8)

## 2021-07-31 LAB — BMP8+EGFR
BUN/Creatinine Ratio: 18 (ref 12–28)
BUN: 19 mg/dL (ref 8–27)
CO2: 20 mmol/L (ref 20–29)
Calcium: 9.6 mg/dL (ref 8.7–10.3)
Chloride: 107 mmol/L — ABNORMAL HIGH (ref 96–106)
Creatinine, Ser: 1.06 mg/dL — ABNORMAL HIGH (ref 0.57–1.00)
Glucose: 105 mg/dL — ABNORMAL HIGH (ref 70–99)
Potassium: 4.1 mmol/L (ref 3.5–5.2)
Sodium: 143 mmol/L (ref 134–144)
eGFR: 59 mL/min/{1.73_m2} — ABNORMAL LOW (ref >59)

## 2021-07-31 LAB — VITAMIN D 25 HYDROXY (VIT D DEFICIENCY, FRACTURES): Vit D, 25-Hydroxy: 36.9 ng/mL (ref 30.0–100.0)

## 2021-08-13 ENCOUNTER — Ambulatory Visit (INDEPENDENT_AMBULATORY_CARE_PROVIDER_SITE_OTHER): Payer: 59 | Admitting: *Deleted

## 2021-08-13 DIAGNOSIS — Z23 Encounter for immunization: Secondary | ICD-10-CM

## 2021-10-03 ENCOUNTER — Other Ambulatory Visit: Payer: Self-pay | Admitting: Family Medicine

## 2021-12-17 ENCOUNTER — Other Ambulatory Visit: Payer: Self-pay | Admitting: Family Medicine

## 2021-12-17 DIAGNOSIS — E559 Vitamin D deficiency, unspecified: Secondary | ICD-10-CM

## 2022-01-31 ENCOUNTER — Ambulatory Visit (INDEPENDENT_AMBULATORY_CARE_PROVIDER_SITE_OTHER): Payer: 59 | Admitting: Family Medicine

## 2022-01-31 ENCOUNTER — Encounter: Payer: Self-pay | Admitting: Family Medicine

## 2022-01-31 VITALS — BP 127/82 | HR 72 | Temp 97.5°F | Ht 63.0 in | Wt 167.0 lb

## 2022-01-31 DIAGNOSIS — M79602 Pain in left arm: Secondary | ICD-10-CM | POA: Diagnosis not present

## 2022-01-31 DIAGNOSIS — Z6829 Body mass index (BMI) 29.0-29.9, adult: Secondary | ICD-10-CM | POA: Diagnosis not present

## 2022-01-31 DIAGNOSIS — E782 Mixed hyperlipidemia: Secondary | ICD-10-CM | POA: Diagnosis not present

## 2022-01-31 DIAGNOSIS — R7303 Prediabetes: Secondary | ICD-10-CM

## 2022-01-31 DIAGNOSIS — Z23 Encounter for immunization: Secondary | ICD-10-CM | POA: Diagnosis not present

## 2022-01-31 LAB — BAYER DCA HB A1C WAIVED: HB A1C (BAYER DCA - WAIVED): 6.1 % — ABNORMAL HIGH (ref 4.8–5.6)

## 2022-01-31 MED ORDER — ATORVASTATIN CALCIUM 20 MG PO TABS: 20.0000 mg | ORAL_TABLET | Freq: Every day | ORAL | 2 refills | Status: DC

## 2022-01-31 MED ORDER — NAPROXEN 500 MG PO TABS: 500.0000 mg | ORAL_TABLET | Freq: Two times a day (BID) | ORAL | 0 refills | Status: AC

## 2022-01-31 NOTE — Patient Instructions (Signed)

## 2022-01-31 NOTE — Progress Notes (Signed)
Subjective:  Patient ID: Alexis Sandoval, female    DOB: 05-Oct-1960, 61 y.o.   MRN: [id number]  Patient Care Team: Baruch Gouty, FNP as PCP - General (Family Medicine)   Chief Complaint:  Medical Management of Chronic Issues (6 month follow up) and Arm Pain (Left upper arm pain x 2 moths on and off )   HPI: Alexis Sandoval is a 61 y.o. female presenting on 01/31/2022 for Medical Management of Chronic Issues (6 month follow up) and Arm Pain (Left upper arm pain x 2 moths on and off )   Arm Pain  The incident occurred more than 1 week ago. There was no injury mechanism. The pain is present in the upper left arm. The quality of the pain is described as aching (dull). The pain does not radiate. The pain is mild. The pain has been Intermittent since the incident. Pertinent negatives include no chest pain, muscle weakness, numbness or tingling. Exacerbated by: stress. She has tried nothing for the symptoms.   1. BMI 29.0-29.9,adult Does try to follow a healthy diet, active daily but does not follow an exercise routine.   2. Prediabetes Does well with diet. Denies polyuria, polyphagia, or polydipsia. No visual changes or rapid changes in weight. No reported hyper- or hypoglycemia symptoms.   3. Mixed hyperlipidemia On statin therapy and tolerating well. No myalgias. Does try to follow a healthy diet, no regular exercise routine.   4. Vitamin D deficiency On Vit D repletion therapy. Denies arthralgias, myalgias, or recent fractures.    Relevant past medical, surgical, family, and social history reviewed and updated as indicated.  Allergies and medications reviewed and updated. Data reviewed: Chart in Epic.   Past Medical History:  Diagnosis Date   GERD (gastroesophageal reflux disease)    does not take anything for it    Past Surgical History:  Procedure Laterality Date   BIOPSY  04/26/2021   Procedure: BIOPSY;  Surgeon: Eloise Harman, DO;  Location: [facility];   Service: Endoscopy;;   COLONOSCOPY WITH PROPOFOL N/A 04/26/2021   Procedure: COLONOSCOPY WITH PROPOFOL;  Surgeon: Eloise Harman, DO;  Location: [facility];  Service: Endoscopy;  Laterality: N/A;  1:15 / ASA 2   ORIF WRIST FRACTURE Right 11/04/2014   Procedure: OPEN REDUCTION INTERNAL FIXATION (ORIF) WRIST FRACTURE;  Surgeon: Roseanne Kaufman, MD;  Location: [facility];  Service: Orthopedics;  Laterality: Right;   PATELLA FRACTURE SURGERY     TUBAL LIGATION      Social History   Socioeconomic History   Marital status: Married    Spouse name: Kyung Rudd   Number of children: 2   Years of education: Not on file   Highest education level: Not on file  Occupational History   Not on file  Tobacco Use   Smoking status: Never   Smokeless tobacco: Never  Vaping Use   Vaping Use: Never used  Substance and Sexual Activity   Alcohol use: No   Drug use: No   Sexual activity: Not on file  Other Topics Concern   Not on file  Social History Narrative   Not on file   Social Determinants of Health   Financial Resource Strain: Not on file  Food Insecurity: Not on file  Transportation Needs: Not on file  Physical Activity: Not on file  Stress: Not on file  Social Connections: Not on file  Intimate Partner Violence: Not on file    Outpatient Encounter Medications as  of 01/31/2022  Medication Sig   Cyanocobalamin (VITAMIN B 12 PO) Take by mouth.   naproxen (NAPROSYN) 500 MG tablet Take 1 tablet (500 mg total) by mouth 2 (two) times daily with a meal for 14 days.   Vitamin D, Ergocalciferol, (DRISDOL) 1.25 MG (50000 UNIT) CAPS capsule TAKE 1 CAPSULE (50,000 UNITS TOTAL) BY MOUTH EVERY 7 (SEVEN) DAYS   [DISCONTINUED] atorvastatin (LIPITOR) 20 MG tablet TAKE 1 TABLET BY MOUTH EVERY DAY   atorvastatin (LIPITOR) 20 MG tablet Take 1 tablet (20 mg total) by mouth daily.   [DISCONTINUED] polyethylene glycol-electrolytes (NULYTELY) 420 g solution SMARTSIG:4000 Milliliter(s) By Mouth Daily (Patient not  taking: Reported on 07/30/2021)   No facility-administered encounter medications on file as of 01/31/2022.    No Known Allergies  Review of Systems  Constitutional:  Negative for activity change, appetite change, chills, diaphoresis, fatigue, fever and unexpected weight change.  HENT: Negative.    Eyes: Negative.  Negative for photophobia and visual disturbance.  Respiratory:  Negative for cough, chest tightness and shortness of breath.   Cardiovascular:  Negative for chest pain, palpitations and leg swelling.  Gastrointestinal:  Negative for abdominal pain, blood in stool, constipation, diarrhea, nausea and vomiting.  Endocrine: Negative.  Negative for cold intolerance, heat intolerance, polydipsia, polyphagia and polyuria.  Genitourinary:  Negative for decreased urine volume, difficulty urinating, dysuria, frequency and urgency.  Musculoskeletal:  Positive for arthralgias and myalgias.  Skin: Negative.   Allergic/Immunologic: Negative.   Neurological:  Negative for dizziness, tingling, tremors, seizures, syncope, facial asymmetry, speech difficulty, weakness, light-headedness, numbness and headaches.  Hematological: Negative.   Psychiatric/Behavioral:  Negative for confusion, hallucinations, sleep disturbance and suicidal ideas.   All other systems reviewed and are negative.       Objective:  BP 127/82   Pulse 72   Temp (!) 97.5 F (36.4 C) (Temporal)   Ht 5' 3" (1.6 m)   Wt 167 lb (75.8 kg)   SpO2 97%   BMI 29.58 kg/m    Wt Readings from Last 3 Encounters:  01/31/22 167 lb (75.8 kg)  07/30/21 168 lb (76.2 kg)  04/26/21 167 lb (75.8 kg)    Physical Exam Vitals and nursing note reviewed.  Constitutional:      General: She is not in acute distress.    Appearance: Normal appearance. She is well-developed, well-groomed and overweight. She is not ill-appearing, toxic-appearing or diaphoretic.  HENT:     Head: Normocephalic and atraumatic.     Jaw: There is normal jaw  occlusion.     Right Ear: Hearing normal.     Left Ear: Hearing normal.     Nose: Nose normal.     Mouth/Throat:     Lips: Pink.     Mouth: Mucous membranes are moist.     Pharynx: Oropharynx is clear. Uvula midline.  Eyes:     General: Lids are normal.     Extraocular Movements: Extraocular movements intact.     Conjunctiva/sclera: Conjunctivae normal.     Pupils: Pupils are equal, round, and reactive to light.  Neck:     Thyroid: No thyroid mass, thyromegaly or thyroid tenderness.     Vascular: No carotid bruit or JVD.     Trachea: Trachea and phonation normal.  Cardiovascular:     Rate and Rhythm: Normal rate and regular rhythm.     Chest Wall: PMI is not displaced.     Pulses: Normal pulses.     Heart sounds: Normal heart sounds. No murmur  heard.    No friction rub. No gallop.  Pulmonary:     Effort: Pulmonary effort is normal. No respiratory distress.     Breath sounds: Normal breath sounds. No wheezing.  Abdominal:     General: Bowel sounds are normal. There is no distension or abdominal bruit.     Palpations: Abdomen is soft. There is no hepatomegaly or splenomegaly.     Tenderness: There is no abdominal tenderness. There is no right CVA tenderness or left CVA tenderness.     Hernia: No hernia is present.  Musculoskeletal:        General: Normal range of motion.     Right shoulder: Normal.     Left shoulder: Normal.     Right upper arm: Normal.     Left upper arm: Normal.     Right elbow: Normal.     Left elbow: Normal.     Cervical back: Normal range of motion and neck supple.     Right lower leg: No edema.     Left lower leg: No edema.  Lymphadenopathy:     Cervical: No cervical adenopathy.  Skin:    General: Skin is warm and dry.     Capillary Refill: Capillary refill takes less than 2 seconds.     Coloration: Skin is not cyanotic, jaundiced or pale.     Findings: No rash.  Neurological:     General: No focal deficit present.     Mental Status: She is  alert and oriented to person, place, and time.     Sensory: Sensation is intact.     Motor: Motor function is intact.     Coordination: Coordination is intact.     Gait: Gait is intact.     Deep Tendon Reflexes: Reflexes are normal and symmetric.  Psychiatric:        Attention and Perception: Attention and perception normal.        Mood and Affect: Mood and affect normal.        Speech: Speech normal.        Behavior: Behavior normal. Behavior is cooperative.        Thought Content: Thought content normal.        Cognition and Memory: Cognition and memory normal.        Judgment: Judgment normal.     Results for orders placed or performed in visit on 01/31/22  Bayer DCA Hb A1c Waived  Result Value Ref Range   HB A1C (BAYER DCA - WAIVED) 6.1 (H) 4.8 - 5.6 %       Pertinent labs & imaging results that were available during my care of the patient were reviewed by me and considered in my medical decision making.  Assessment & Plan:  Serrina was seen today for medical management of chronic issues and arm pain.  Diagnoses and all orders for this visit:  Prediabetes A1C 6.1. diet and exercise discussed in detail. Aware if A1C remains elevated, will initiate medications. Other labs pending. EKG without acute changes.  -     Lipid panel -     CBC with Differential/Platelet -     CMP14+EGFR -     Bayer DCA Hb A1c Waived -     EKG 12-Lead  BMI 29.0-29.9,adult Diet and exercise encouraged. Labs pending.  -     Lipid panel -     CBC with Differential/Platelet -     CMP14+EGFR -     Bayer DCA Hb A1c Waived -  EKG 12-Lead  Mixed hyperlipidemia Diet encouraged - increase intake of fresh fruits and vegetables, increase intake of lean proteins. Bake, broil, or grill foods. Avoid fried, greasy, and fatty foods. Avoid fast foods. Increase intake of fiber-rich whole grains. Exercise encouraged - at least 150 minutes per week and advance as tolerated. Goal BMI < 25. Continue medications  as prescribed. Follow up in 3-6 months as discussed.  -     Lipid panel -     CBC with Differential/Platelet -     CMP14+EGFR -     atorvastatin (LIPITOR) 20 MG tablet; Take 1 tablet (20 mg total) by mouth daily.  Left arm pain Intermittent pain worse with stress. Due to history of prediabetes, elevated BMI, and hyperlipidemia; EKG was completed. No acute findings on EKG. Will burst with below to see if beneficial. Report new, worsening, or persistent symptoms.  -     EKG 12-Lead -     naproxen (NAPROSYN) 500 MG tablet; Take 1 tablet (500 mg total) by mouth 2 (two) times daily with a meal for 14 days.  Need for shingles vaccine -     Zoster Recombinant (Shingrix )     Continue all other maintenance medications.  Follow up plan: Return in about 6 months (around 08/02/2022), or if symptoms worsen or fail to improve, for prediabetes, Vit D.   Continue healthy lifestyle choices, including diet (rich in fruits, vegetables, and lean proteins, and low in salt and simple carbohydrates) and exercise (at least 30 minutes of moderate physical activity daily).  Educational handout given for DM  The above assessment and management plan was discussed with the patient. The patient verbalized understanding of and has agreed to the management plan. Patient is aware to call the clinic if they develop any new symptoms or if symptoms persist or worsen. Patient is aware when to return to the clinic for a follow-up visit. Patient educated on when it is appropriate to go to the emergency department.   Monia Pouch, FNP-C [facility] [phone]

## 2022-02-01 LAB — CBC WITH DIFFERENTIAL/PLATELET
Basophils Absolute: 0 10*3/uL (ref 0.0–0.2)
Basos: 1 %
EOS (ABSOLUTE): 0.1 10*3/uL (ref 0.0–0.4)
Eos: 2 %
Hematocrit: 38.4 % (ref 34.0–46.6)
Hemoglobin: 13.3 g/dL (ref 11.1–15.9)
Immature Grans (Abs): 0 10*3/uL (ref 0.0–0.1)
Immature Granulocytes: 0 %
Lymphocytes Absolute: 2.1 10*3/uL (ref 0.7–3.1)
Lymphs: 41 %
MCH: 31.5 pg (ref 26.6–33.0)
MCHC: 34.6 g/dL (ref 31.5–35.7)
MCV: 91 fL (ref 79–97)
Monocytes Absolute: 0.4 10*3/uL (ref 0.1–0.9)
Monocytes: 7 %
Neutrophils Absolute: 2.5 10*3/uL (ref 1.4–7.0)
Neutrophils: 49 %
Platelets: 282 10*3/uL (ref 150–450)
RBC: 4.22 x10E6/uL (ref 3.77–5.28)
RDW: 11.2 % — ABNORMAL LOW (ref 11.7–15.4)
WBC: 5.1 10*3/uL (ref 3.4–10.8)

## 2022-02-01 LAB — LIPID PANEL
Chol/HDL Ratio: 2.8 ratio (ref 0.0–4.4)
Cholesterol, Total: 105 mg/dL (ref 100–199)
HDL: 37 mg/dL — ABNORMAL LOW (ref >39)
LDL Chol Calc (NIH): 51 mg/dL (ref 0–99)
Triglycerides: 82 mg/dL (ref 0–149)
VLDL Cholesterol Cal: 17 mg/dL (ref 5–40)

## 2022-02-01 LAB — CMP14+EGFR
ALT: 27 IU/L (ref 0–32)
AST: 22 IU/L (ref 0–40)
Albumin/Globulin Ratio: 1.8 (ref 1.2–2.2)
Albumin: 4.3 g/dL (ref 3.9–4.9)
Alkaline Phosphatase: 83 IU/L (ref 44–121)
BUN/Creatinine Ratio: 16 (ref 12–28)
BUN: 15 mg/dL (ref 8–27)
Bilirubin Total: 0.4 mg/dL (ref 0.0–1.2)
CO2: 20 mmol/L (ref 20–29)
Calcium: 8.7 mg/dL (ref 8.7–10.3)
Chloride: 106 mmol/L (ref 96–106)
Creatinine, Ser: 0.94 mg/dL (ref 0.57–1.00)
Globulin, Total: 2.4 g/dL (ref 1.5–4.5)
Glucose: 87 mg/dL (ref 70–99)
Potassium: 3.9 mmol/L (ref 3.5–5.2)
Sodium: 140 mmol/L (ref 134–144)
Total Protein: 6.7 g/dL (ref 6.0–8.5)
eGFR: 68 mL/min/{1.73_m2} (ref >59)

## 2022-02-14 ENCOUNTER — Ambulatory Visit (INDEPENDENT_AMBULATORY_CARE_PROVIDER_SITE_OTHER): Payer: 59

## 2022-02-14 DIAGNOSIS — Z23 Encounter for immunization: Secondary | ICD-10-CM

## 2022-05-07 ENCOUNTER — Other Ambulatory Visit: Payer: Self-pay | Admitting: Family Medicine

## 2022-05-07 DIAGNOSIS — E559 Vitamin D deficiency, unspecified: Secondary | ICD-10-CM

## 2022-07-20 IMAGING — MG MM BREAST BX W/ LOC DEV EA AD LESION IMAG BX SPEC STEREO GUIDE*R
8 series · 8 of 16 positions shown · non-contrast
Comparison: Previous exams.
COMPARISON: Previous exams.

Addendum:
CLINICAL DATA: 60-year-old with a screening detected 4 mm group of
indeterminate calcifications involving the upper RIGHT breast at
middle depth, near 12 o'clock location.

EXAM:
RIGHT BREAST STEREOTACTIC CORE NEEDLE BIOPSY

[R (1 of 3)]
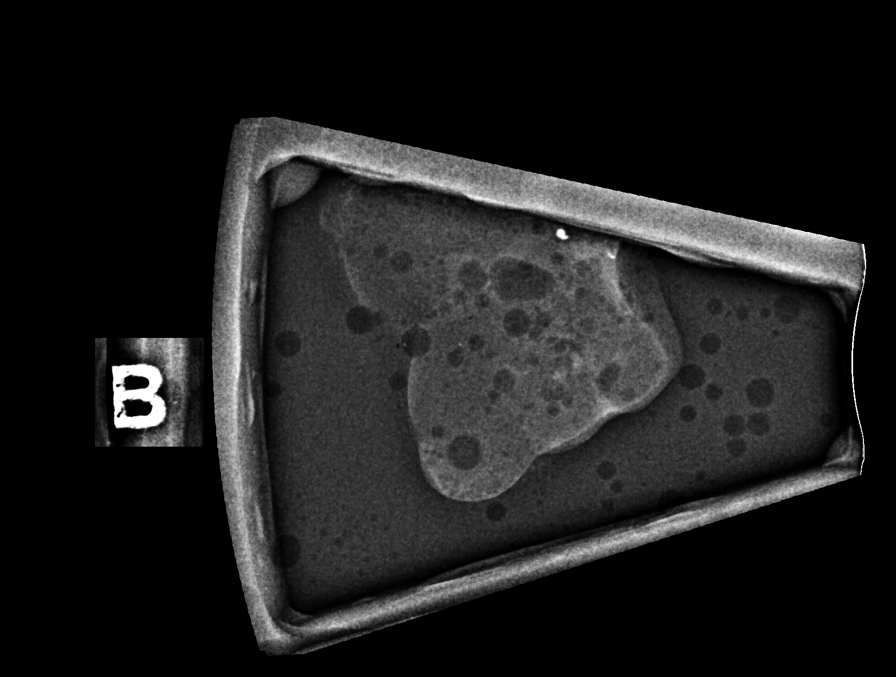

[R (2 of 3)]
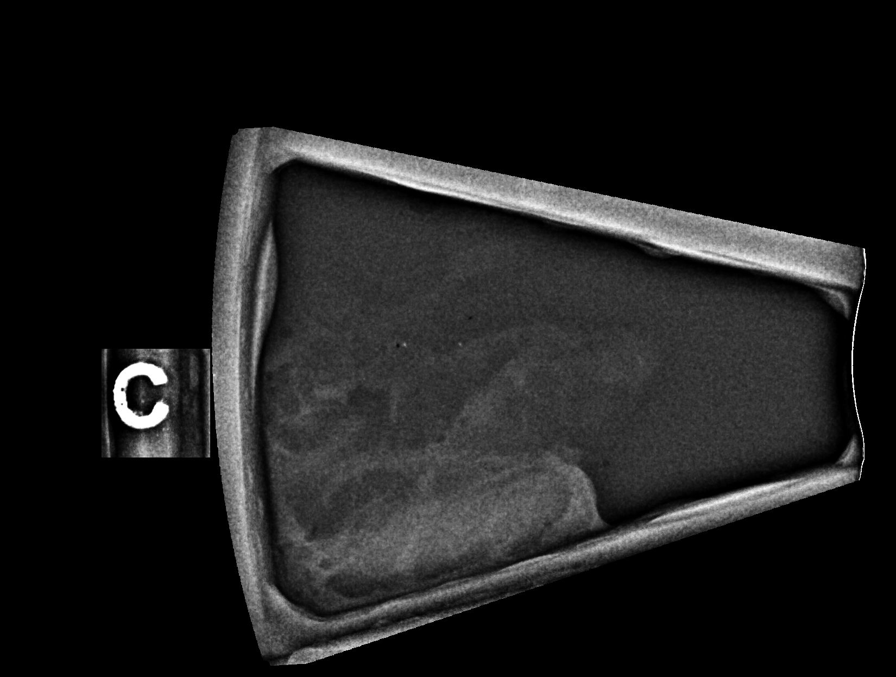

[R (3 of 3)]
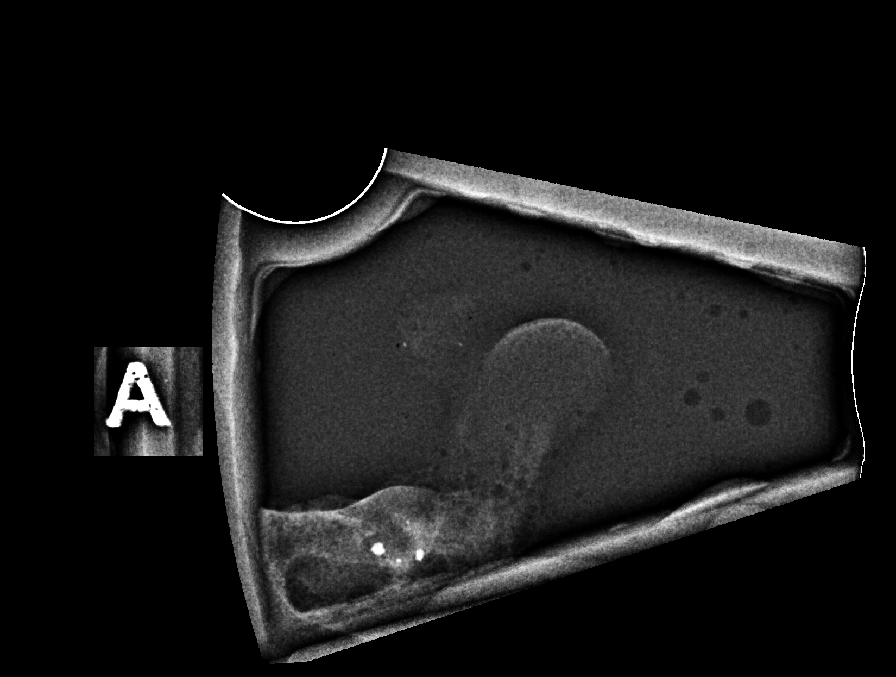

[R CC (1 of 3)]
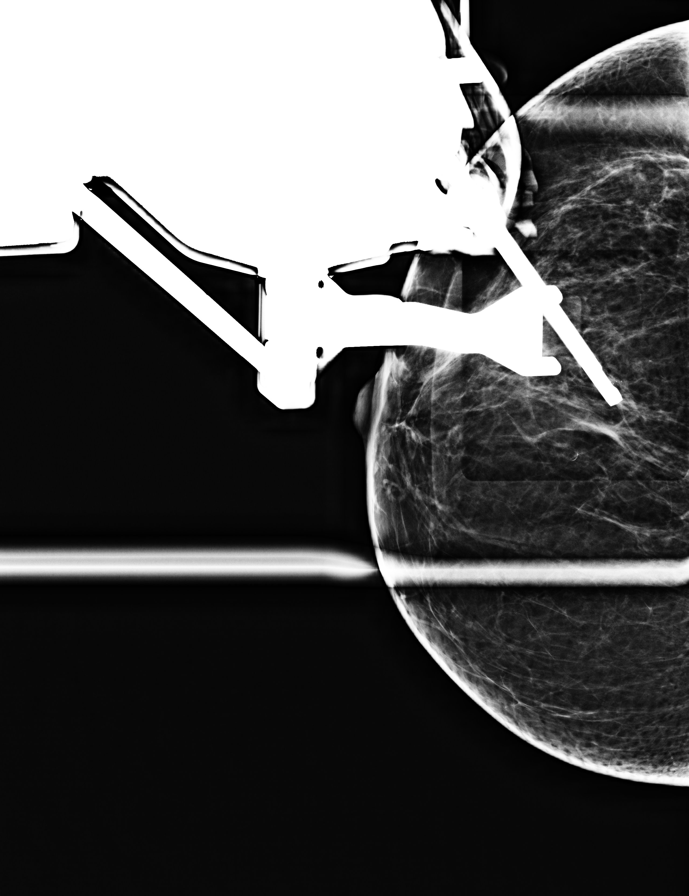

[R CC (2 of 3)]
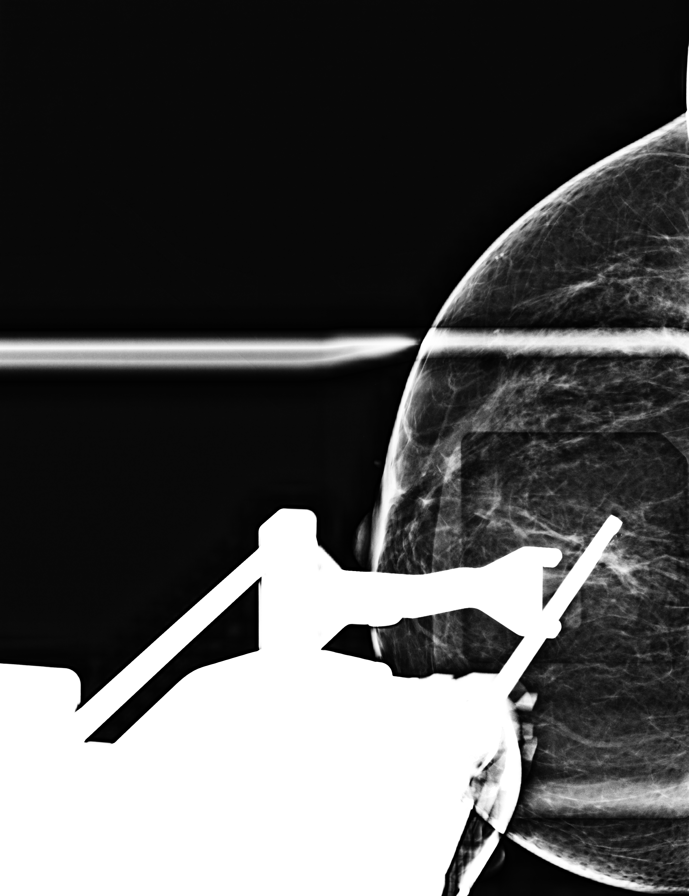

[R CC (3 of 3)]
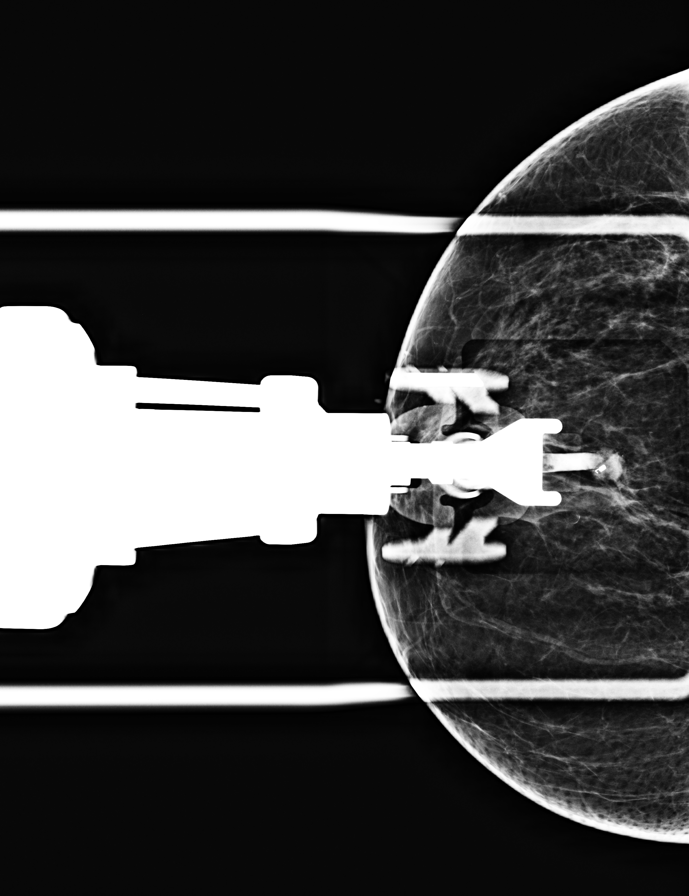

[R CC tomo (1 of 2) · tomo slice 29/58.0]
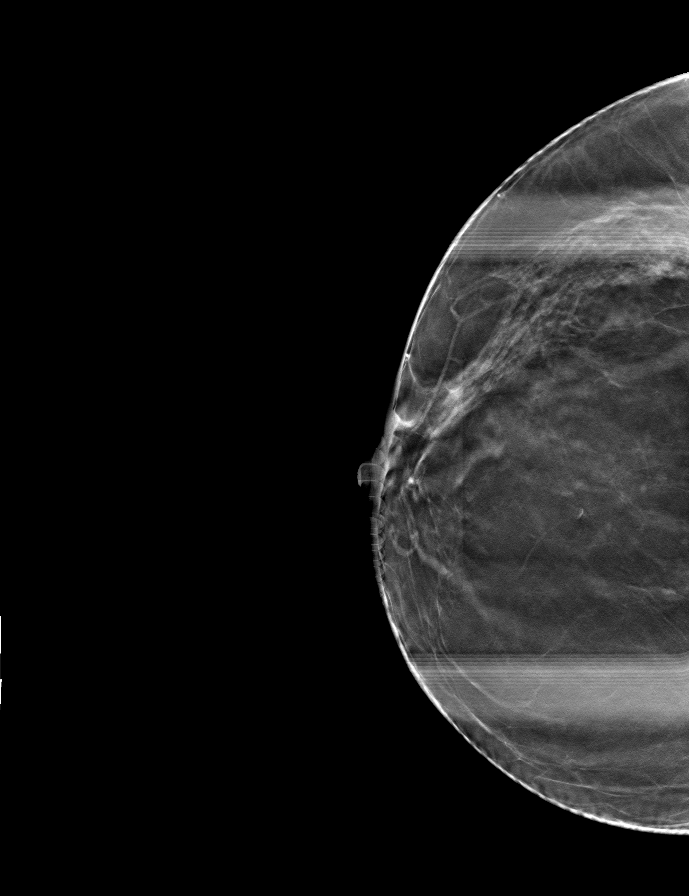

[R CC tomo (2 of 2) · tomo slice 29/58.0]
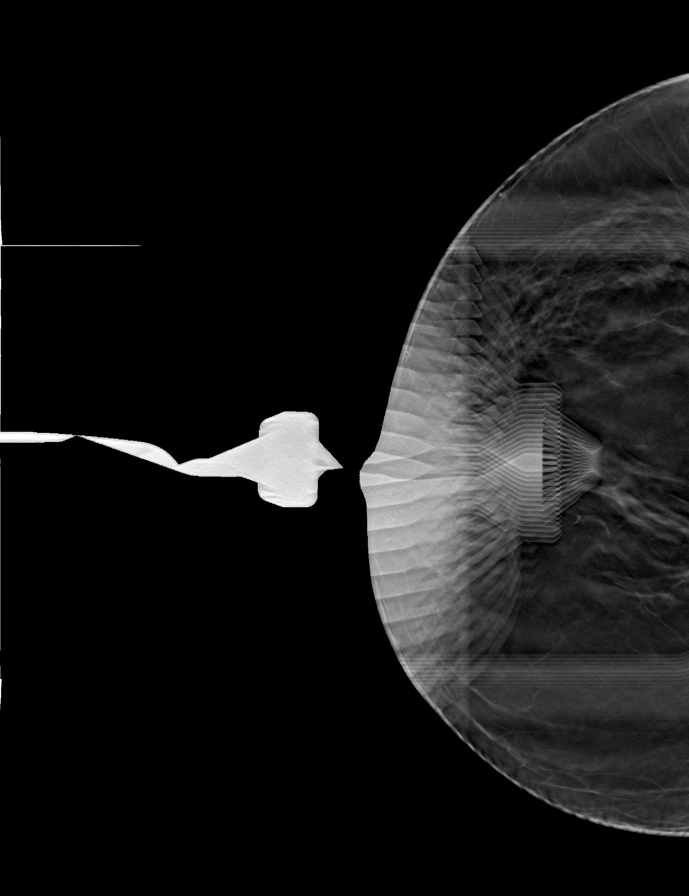

[8 of 16 positions shown; findings below may reference images not displayed]



Lesion quadrant: Upper breast, near 12 o'clock location.

Using sterile technique with chlorhexidine as skin antisepsis, 1%
lidocaine and 1% lidocaine with epinephrine as local anesthetic,
under stereotactic guidance, a 9 gauge Brevera vacuum assisted
device was used to perform core needle biopsy of calcifications in
the upper RIGHT breast using a superior approach. Specimen
radiograph was performed showing calcifications in 2 of the 3 core
samples. Specimens with calcifications are identified for pathology.

At the conclusion of the procedure, a coil shaped tissue marker clip
was deployed into the biopsy cavity. Follow-up 2-view mammogram was
performed and dictated separately.
IMPRESSION: Stereotactic-guided biopsy of a 4 mm group of indeterminate
calcifications involving the upper RIGHT breast. No apparent
complications.

ADDENDUM:
Pathology revealed BENIGN BREAST TISSUE WITH INCREASED STROMAL
FIBROSIS, PIGMENTED HISTIOCYTES AND CALCIFICATIONS

- NO MALIGNANCY IDENTIFIED of the RIGHT breast, upper at middle
depth, near 12:00 o'clock, coil clip. This was found to be
concordant by Dr. Jiwon Turay.

Pathology results were discussed with the patient by telephone. The
patient reported doing well after the biopsy with tenderness at the
site. Post biopsy instructions and care were reviewed and questions
were answered. The patient was encouraged to call The [REDACTED]

The patient was instructed to return for annual screening
mammography due December 2021 [REDACTED] and informed a reminder
notice would be sent regarding this appointment.

Pathology results reported by [employer] [facility] RN on 01/08/2021.



Lesion quadrant: Upper breast, near 12 o'clock location.

Using sterile technique with chlorhexidine as skin antisepsis, 1%
lidocaine and 1% lidocaine with epinephrine as local anesthetic,
under stereotactic guidance, a 9 gauge Brevera vacuum assisted
device was used to perform core needle biopsy of calcifications in
the upper RIGHT breast using a superior approach. Specimen
radiograph was performed showing calcifications in 2 of the 3 core
samples. Specimens with calcifications are identified for pathology.

At the conclusion of the procedure, a coil shaped tissue marker clip
was deployed into the biopsy cavity. Follow-up 2-view mammogram was
performed and dictated separately.
IMPRESSION: Stereotactic-guided biopsy of a 4 mm group of indeterminate
calcifications involving the upper RIGHT breast. No apparent
complications.

## 2022-07-20 IMAGING — MG MM BREAST LOCALIZATION CLIP
4 series · 4 of 12 positions shown · non-contrast
Comparison: Previous exam(s).

CLINICAL DATA: Confirmation of clip placement after stereotactic
tomosynthesis core needle biopsy of an indeterminate group of
calcifications in the upper RIGHT breast.

EXAM:
2D and 3D DIAGNOSTIC RIGHT MAMMOGRAM POST STEREOTACTIC TOMOSYNTHESIS
BIOPSY

[R CC synth-2D]
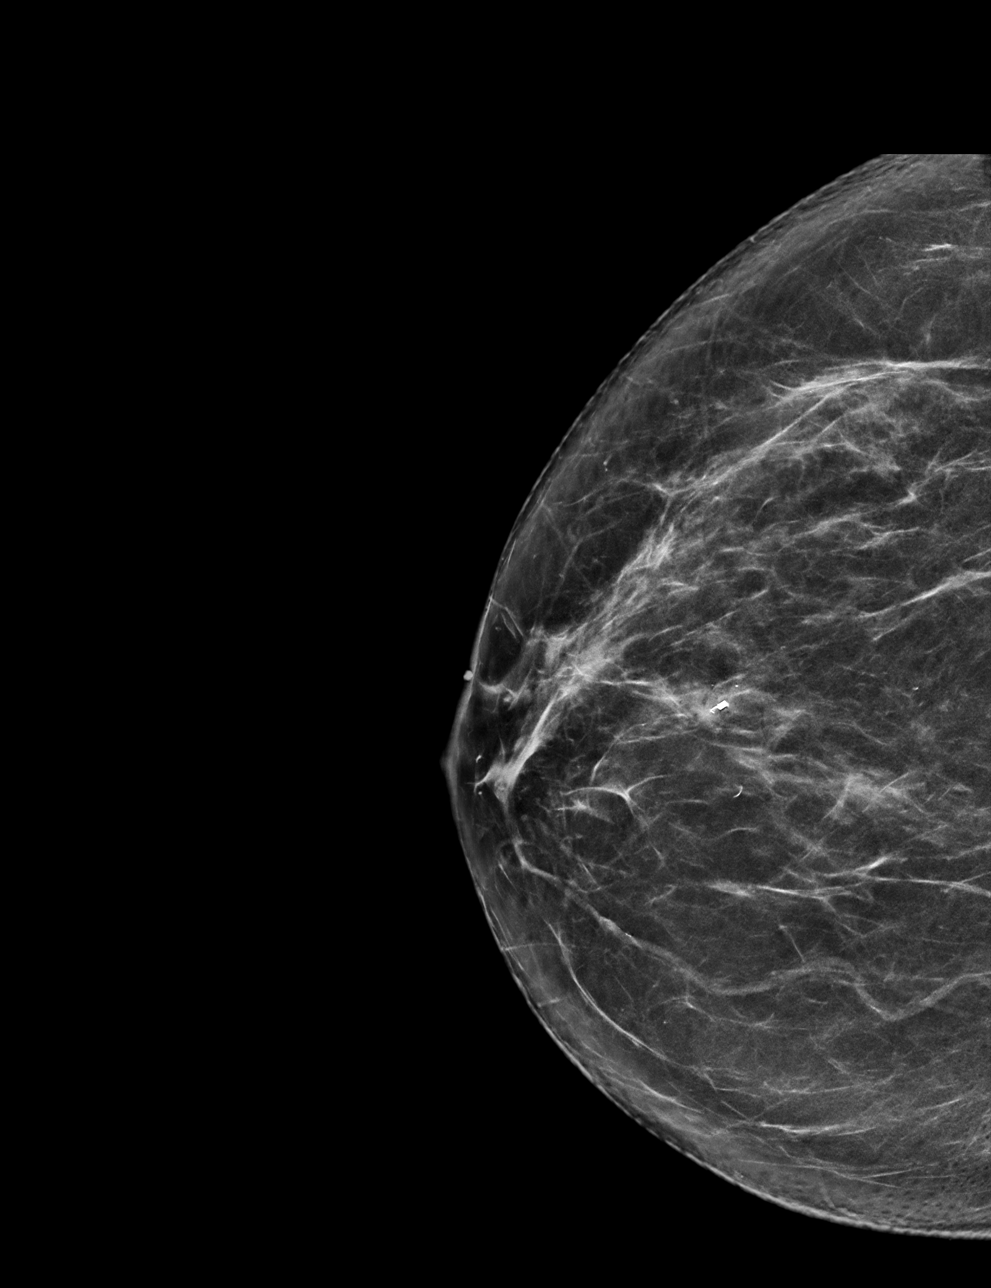

[R ML synth-2D]
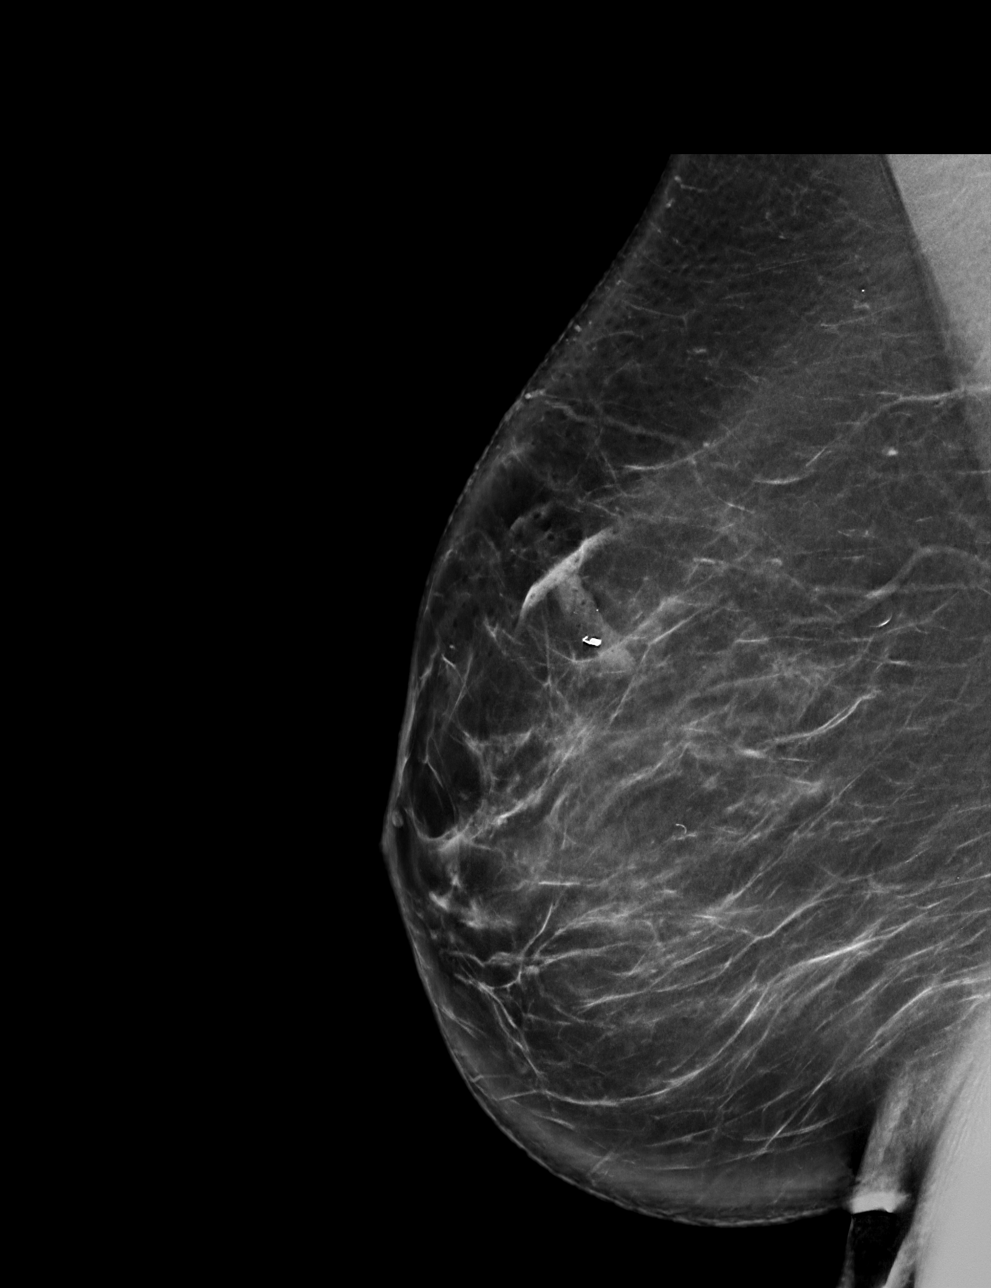

[R ML tomo · tomo slice 43/84.0]
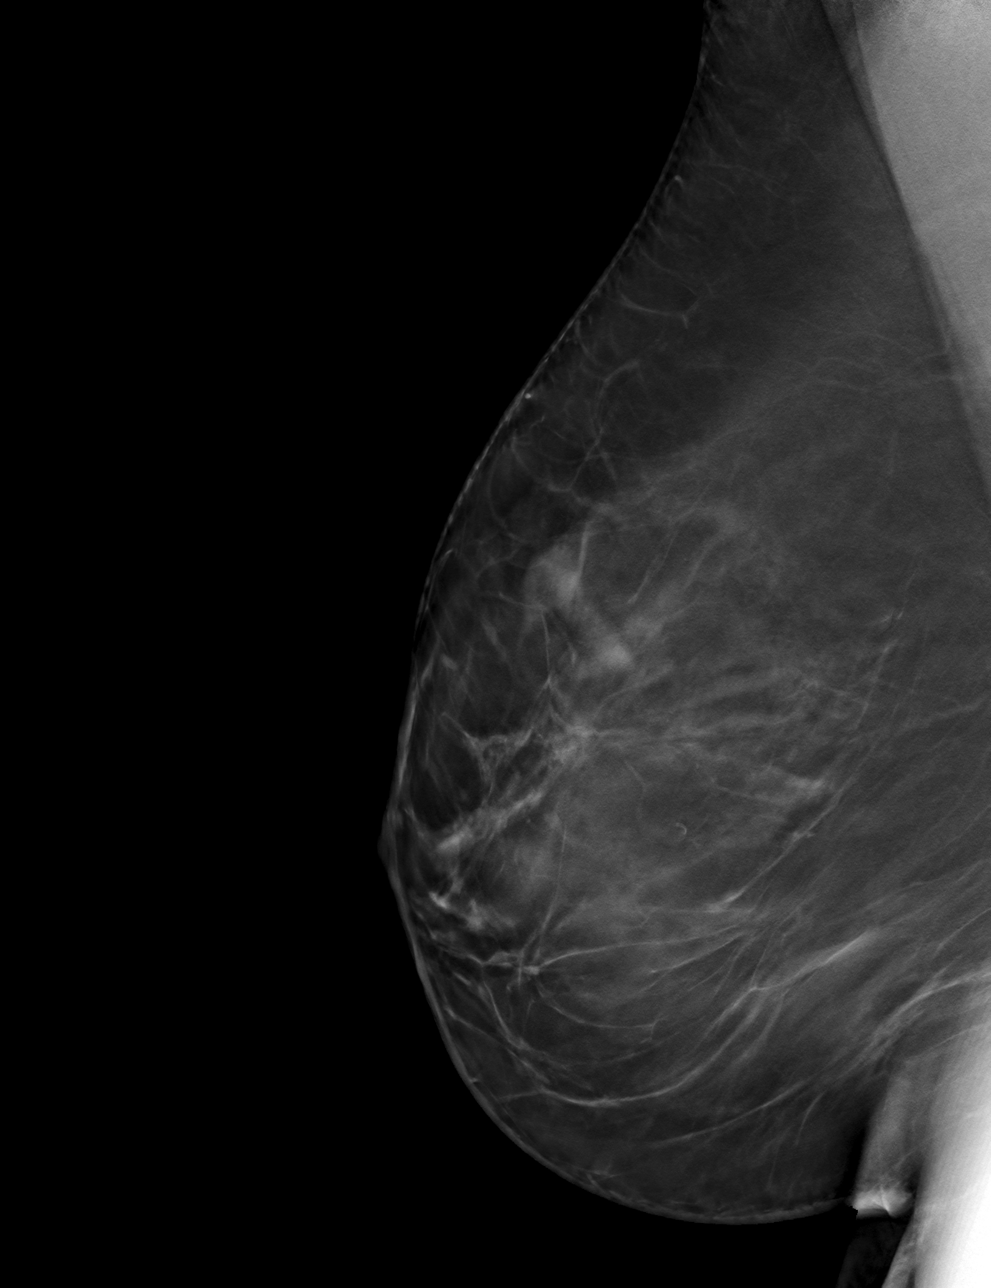

[R CC tomo · tomo slice 36/71.0]
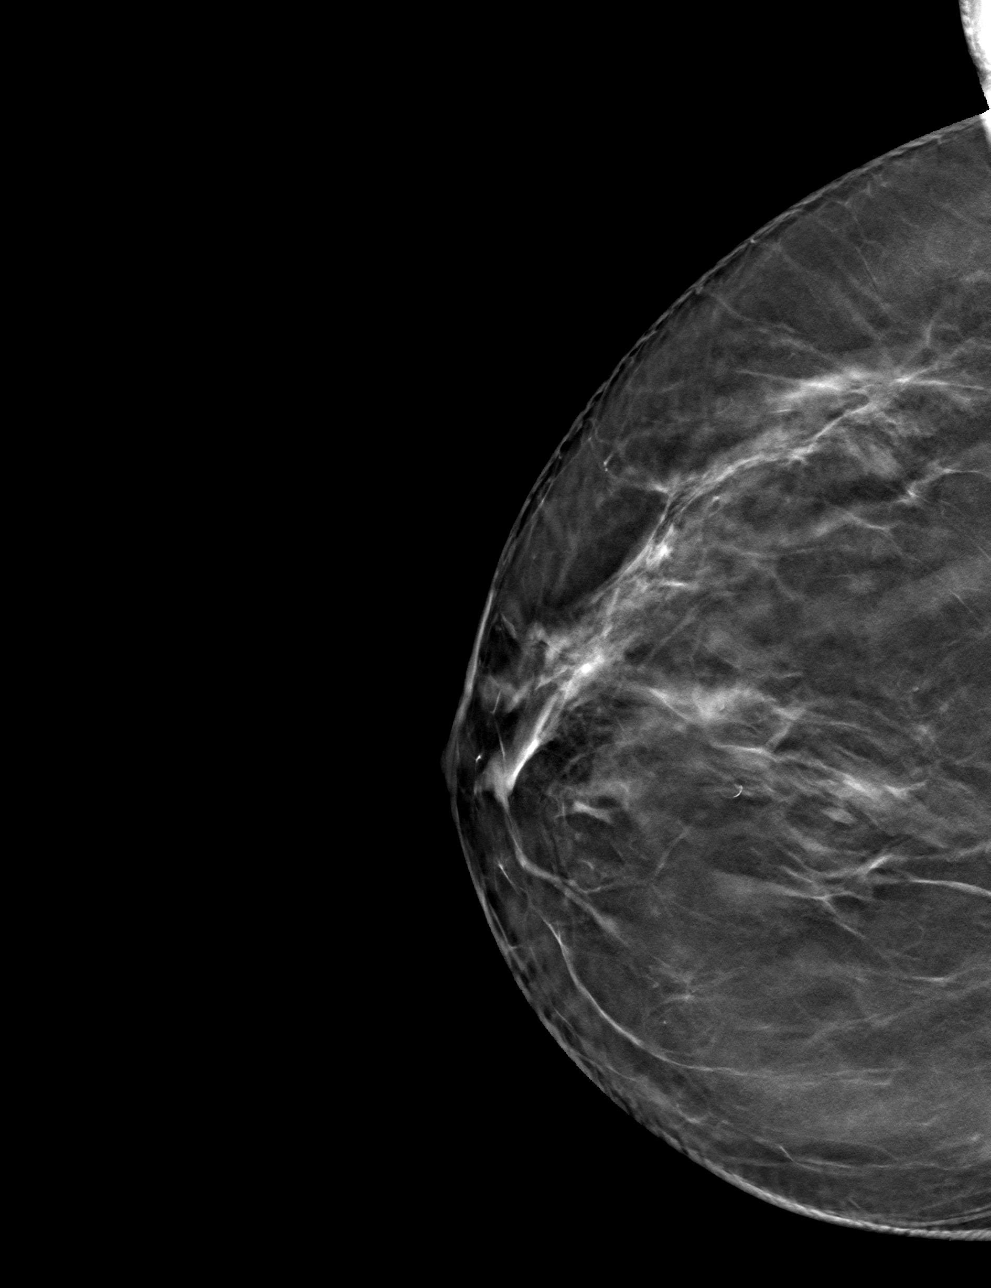

[4 of 12 positions shown; findings below may reference images not displayed]

FINDINGS: Tomosynthesis and synthesized full field CC and mediolateral images
were obtained following stereotactic tomosynthesis guided biopsy of
calcifications involving the upper RIGHT breast at the near 12
o'clock location. The coil shaped tissue marking clip is
appropriately positioned at the site of the biopsied calcifications.
There are a few residual calcifications at the biopsy site.

Expected post biopsy changes are present without evidence of
hematoma.
IMPRESSION: Appropriate positioning of the coil shaped biopsy marking clip at
the site of the biopsied calcifications in the upper RIGHT breast at
middle depth at the 12 o'clock location. There are a few residual
calcifications at the biopsy site.

Final Assessment: Post Procedure Mammograms for Marker Placement

## 2022-08-05 ENCOUNTER — Ambulatory Visit (INDEPENDENT_AMBULATORY_CARE_PROVIDER_SITE_OTHER): Payer: 59 | Admitting: Family Medicine

## 2022-08-05 ENCOUNTER — Encounter: Payer: Self-pay | Admitting: Family Medicine

## 2022-08-05 VITALS — BP 126/76 | HR 66 | Temp 97.8°F | Resp 20 | Ht 63.0 in | Wt 171.0 lb

## 2022-08-05 DIAGNOSIS — E559 Vitamin D deficiency, unspecified: Secondary | ICD-10-CM | POA: Diagnosis not present

## 2022-08-05 DIAGNOSIS — E782 Mixed hyperlipidemia: Secondary | ICD-10-CM

## 2022-08-05 DIAGNOSIS — R7303 Prediabetes: Secondary | ICD-10-CM

## 2022-08-05 LAB — BAYER DCA HB A1C WAIVED: HB A1C (BAYER DCA - WAIVED): 6.1 % — ABNORMAL HIGH (ref 4.8–5.6)

## 2022-08-05 NOTE — Patient Instructions (Addendum)
Continue to monitor your blood sugars as we discussed and record them. Bring the log to your next appointment.  Take your medications as directed.    Goal Blood glucose:    Fasting (before meals) = 80 to 130   Within 2 hours of eating = less than 180   Understanding your Hemoglobin A1c: 6.1     Diabetes Mellitus and Nutrition    I think that you would greatly benefit from seeing a nutritionist. If this is something you are interested in, please call Dr Gerilyn Pilgrim at [phone] to schedule an appointment.   When you have diabetes (diabetes mellitus), it is very important to have healthy eating habits because your blood sugar (glucose) levels are greatly affected by what you eat and drink. Eating healthy foods in the appropriate amounts, at about the same times every day, can help you: Control your blood glucose. Lower your risk of heart disease. Improve your blood pressure. Reach or maintain a healthy weight.  Every person with diabetes is different, and each person has different needs for a meal plan. Your health care provider may recommend that you work with a diet and nutrition specialist (dietitian) to make a meal plan that is best for you. Your meal plan may vary depending on factors such as: The calories you need. The medicines you take. Your weight. Your blood glucose, blood pressure, and cholesterol levels. Your activity level. Other health conditions you have, such as heart or kidney disease.  How do carbohydrates affect me? Carbohydrates affect your blood glucose level more than any other type of food. Eating carbohydrates naturally increases the amount of glucose in your blood. Carbohydrate counting is a method for keeping track of how many carbohydrates you eat. Counting carbohydrates is important to keep your blood glucose at a healthy level, especially if you use insulin or take certain oral diabetes medicines. It is important to know how many carbohydrates you can safely  have in each meal. This is different for every person. Your dietitian can help you calculate how many carbohydrates you should have at each meal and for snack. Foods that contain carbohydrates include: Bread, cereal, rice, pasta, and crackers. Potatoes and corn. Peas, beans, and lentils. Milk and yogurt. Fruit and juice. Desserts, such as cakes, cookies, ice cream, and candy.  How does alcohol affect me? Alcohol can cause a sudden decrease in blood glucose (hypoglycemia), especially if you use insulin or take certain oral diabetes medicines. Hypoglycemia can be a life-threatening condition. Symptoms of hypoglycemia (sleepiness, dizziness, and confusion) are similar to symptoms of having too much alcohol. If your health care provider says that alcohol is safe for you, follow these guidelines: Limit alcohol intake to no more than 1 drink per day for nonpregnant women and 2 drinks per day for men. One drink equals 12 oz of beer, 5 oz of wine, or 1 oz of hard liquor. Do not drink on an empty stomach. Keep yourself hydrated with water, diet soda, or unsweetened iced tea. Keep in mind that regular soda, juice, and other mixers may contain a lot of sugar and must be counted as carbohydrates.  What are tips for following this plan?  Reading food labels Start by checking the serving size on the label. The amount of calories, carbohydrates, fats, and other nutrients listed on the label are based on one serving of the food. Many foods contain more than one serving per package. Check the total grams (g) of carbohydrates in one serving. You can calculate  the number of servings of carbohydrates in one serving by dividing the total carbohydrates by 15. For example, if a food has 30 g of total carbohydrates, it would be equal to 2 servings of carbohydrates. Check the number of grams (g) of saturated and trans fats in one serving. Choose foods that have low or no amount of these fats. Check the number of  milligrams (mg) of sodium in one serving. Most people should limit total sodium intake to less than 2,300 mg per day. Always check the nutrition information of foods labeled as "low-fat" or "nonfat". These foods may be higher in added sugar or refined carbohydrates and should be avoided. Talk to your dietitian to identify your daily goals for nutrients listed on the label.  Shopping Avoid buying canned, premade, or processed foods. These foods tend to be high in fat, sodium, and added sugar. Shop around the outside edge of the grocery store. This includes fresh fruits and vegetables, bulk grains, fresh meats, and fresh dairy.  Cooking Use low-heat cooking methods, such as baking, instead of high-heat cooking methods like deep frying. Cook using healthy oils, such as olive, canola, or sunflower oil. Avoid cooking with butter, cream, or high-fat meats.  Meal planning Eat meals and snacks regularly, preferably at the same times every day. Avoid going long periods of time without eating. Eat foods high in fiber, such as fresh fruits, vegetables, beans, and whole grains. Talk to your dietitian about how many servings of carbohydrates you can eat at each meal. Eat 4-6 ounces of lean protein each day, such as lean meat, chicken, fish, eggs, or tofu. 1 ounce is equal to 1 ounce of meat, chicken, or fish, 1 egg, or 1/4 cup of tofu. Eat some foods each day that contain healthy fats, such as avocado, nuts, seeds, and fish.  Lifestyle  Check your blood glucose regularly. Exercise at least 30 minutes 5 or more days each week, or as told by your health care provider. Take medicines as told by your health care provider. Do not use any products that contain nicotine or tobacco, such as cigarettes and e-cigarettes. If you need help quitting, ask your health care provider. Work with a Veterinary surgeon or diabetes educator to identify strategies to manage stress and any emotional and social challenges.  What are  some questions to ask my health care provider? Do I need to meet with a diabetes educator? Do I need to meet with a dietitian? What number can I call if I have questions? When are the best times to check my blood glucose?  Where to find more information: American Diabetes Association: diabetes.org/food-and-fitness/food Academy of Nutrition and Dietetics: https://www.vargas.com/ [employer] of Diabetes and Digestive and Kidney Diseases (NIH): FindJewelers.cz  Summary A healthy meal plan will help you control your blood glucose and maintain a healthy lifestyle. Working with a diet and nutrition specialist (dietitian) can help you make a meal plan that is best for you. Keep in mind that carbohydrates and alcohol have immediate effects on your blood glucose levels. It is important to count carbohydrates and to use alcohol carefully. This information is not intended to replace advice given to you by your health care provider. Make sure you discuss any questions you have with your health care provider. Document Released: 11/14/2004 Document Revised: 03/24/2016 Document Reviewed: 03/24/2016 Elsevier Interactive Patient Education  2018 [employer]     Goal BP:  For patients younger than 60: Goal BP < 140/90. For patients 60 and older: Goal  BP < 150/90. For patients with diabetes: Goal BP < 140/90.  Take your medications faithfully as prescribed. Maintain a healthy weight. Get at least 150 minutes of aerobic exercise per week. Minimize salt intake, less than 2000 mg per day. Minimize alcohol intake.  DASH Eating Plan DASH stands for "Dietary Approaches to Stop Hypertension." The DASH eating plan is a healthy eating plan that has been shown to reduce high blood pressure (hypertension). Additional health benefits may include reducing the risk of type 2 diabetes mellitus,  heart disease, and stroke. The DASH eating plan may also help with weight loss.  WHAT DO I NEED TO KNOW ABOUT THE DASH EATING PLAN? For the DASH eating plan, you will follow these general guidelines: Choose foods with a percent daily value for sodium of less than 5% (as listed on the food label). Use salt-free seasonings or herbs instead of table salt or sea salt. Check with your health care provider or pharmacist before using salt substitutes. Eat lower-sodium products, often labeled as "lower sodium" or "no salt added." Eat fresh foods. Eat more vegetables, fruits, and low-fat dairy products. Choose whole grains. Look for the word "whole" as the first word in the ingredient list. Choose fish and skinless chicken or [place] more often than red meat. Limit fish, poultry, and meat to 6 oz (170 g) each day. Limit sweets, desserts, sugars, and sugary drinks. Choose heart-healthy fats. Limit cheese to 1 oz (28 g) per day. Eat more home-cooked food and less restaurant, buffet, and fast food. Limit fried foods. Cook foods using methods other than frying. Limit canned vegetables. If you do use them, rinse them well to decrease the sodium. When eating at a restaurant, ask that your food be prepared with less salt, or no salt if possible.  WHAT FOODS CAN I EAT? Seek help from a dietitian for individual calorie needs.  Grains Whole grain or whole wheat bread. Brown rice. Whole grain or whole wheat pasta. Quinoa, bulgur, and whole grain cereals. Low-sodium cereals. Corn or whole wheat flour tortillas. Whole grain cornbread. Whole grain crackers. Low-sodium crackers.  Vegetables Fresh or frozen vegetables (raw, steamed, roasted, or grilled). Low-sodium or reduced-sodium tomato and vegetable juices. Low-sodium or reduced-sodium tomato sauce and paste. Low-sodium or reduced-sodium canned vegetables.   Fruits All fresh, canned (in natural juice), or frozen fruits.  Meat and Other Protein  Products Ground beef (85% or leaner), grass-fed beef, or beef trimmed of fat. Skinless chicken or [place]. Ground chicken or [place]. Pork trimmed of fat. All fish and seafood. Eggs. Dried beans, peas, or lentils. Unsalted nuts and seeds. Unsalted canned beans.  Dairy Low-fat dairy products, such as skim or 1% milk, 2% or reduced-fat cheeses, low-fat ricotta or cottage cheese, or plain low-fat yogurt. Low-sodium or reduced-sodium cheeses.  Fats and Oils Tub margarines without trans fats. Light or reduced-fat mayonnaise and salad dressings (reduced sodium). Avocado. Safflower, olive, or canola oils. Natural peanut or almond butter.  Other Unsalted popcorn and pretzels. The items listed above may not be a complete list of recommended foods or beverages. Contact your dietitian for more options.  WHAT FOODS ARE NOT RECOMMENDED?  Grains White bread. White pasta. White rice. Refined cornbread. Bagels and croissants. Crackers that contain trans fat.  Vegetables Creamed or fried vegetables. Vegetables in a cheese sauce. Regular canned vegetables. Regular canned tomato sauce and paste. Regular tomato and vegetable juices.  Fruits Dried fruits. Canned fruit in light or heavy syrup. Fruit juice.  Meat and Other Protein  Products Fatty cuts of meat. Ribs, chicken wings, bacon, sausage, bologna, salami, chitterlings, fatback, hot dogs, bratwurst, and packaged luncheon meats. Salted nuts and seeds. Canned beans with salt.  Dairy Whole or 2% milk, cream, half-and-half, and cream cheese. Whole-fat or sweetened yogurt. Full-fat cheeses or blue cheese. Nondairy creamers and whipped toppings. Processed cheese, cheese spreads, or cheese curds.  Condiments Onion and garlic salt, seasoned salt, table salt, and sea salt. Canned and packaged gravies. Worcestershire sauce. Tartar sauce. Barbecue sauce. Teriyaki sauce. Soy sauce, including reduced sodium. Steak sauce. Fish sauce. Oyster sauce. Cocktail sauce.  Horseradish. Ketchup and mustard. Meat flavorings and tenderizers. Bouillon cubes. Hot sauce. Tabasco sauce. Marinades. Taco seasonings. Relishes.  Fats and Oils Butter, stick margarine, lard, shortening, ghee, and bacon fat. Coconut, palm kernel, or palm oils. Regular salad dressings.  Other Pickles and olives. Salted popcorn and pretzels.  The items listed above may not be a complete list of foods and beverages to avoid. Contact your dietitian for more information.  WHERE CAN I FIND MORE INFORMATION? National Heart, Lung, and Blood Institute: CablePromo.it Document Released: 02/06/2011 Document Revised: 07/04/2013 Document Reviewed: 12/22/2012 [facility] Patient Information 2015 [place], [place]. This information is not intended to replace advice given to you by your health care provider. Make sure you discuss any questions you have with your health care provider.   I think that you would greatly benefit from seeing a nutritionist.  If you are interested, please call Dr. Gerilyn Pilgrim at [phone] to schedule an appointment.

## 2022-08-05 NOTE — Progress Notes (Signed)
Subjective:  Patient ID: Alexis Sandoval, female    DOB: 1960-06-22, 62 y.o.   MRN: [id number]  Patient Care Team: Sonny Masters, FNP as PCP - General (Family Medicine)   Chief Complaint:  Medical Management of Chronic Issues   HPI: Alexis Sandoval is a 62 y.o. female presenting on 08/05/2022 for Medical Management of Chronic Issues    1. Prediabetes Has been poorly with diet and exercise. Denies polyuria, polyphagia, or polydipsia.    2. Mixed hyperlipidemia Has been doing poorly with diet and exercise. Currently not on medications.    3. BMI 31.28 ,adult Has been doing poorly with diet and exercise. Has gained 4 pounds since last visit.    4. Vitamin D deficiency On repletion therapy and tolerating well. No arthralgias, trouble walking, or recent fractures.   Pt has additional and complicating stressors to health and self care of husband starting treatment for liver cancer at [facility]. 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  Allergies and medications reviewed and updated. Data reviewed: Chart in Epic.   Past Medical History:  Diagnosis Date   GERD (gastroesophageal reflux disease)    does not take anything for it    Past Surgical History:  Procedure Laterality Date   BIOPSY  04/26/2021   Procedure: BIOPSY;  Surgeon: Lanelle Bal, DO;  Location: [facility];  Service: Endoscopy;;   COLONOSCOPY WITH PROPOFOL N/A 04/26/2021   Procedure: COLONOSCOPY WITH PROPOFOL;  Surgeon: Lanelle Bal, DO;  Location: [facility];  Service: Endoscopy;  Laterality: N/A;  1:15 / ASA 2   ORIF WRIST FRACTURE Right 11/04/2014   Procedure: OPEN REDUCTION INTERNAL FIXATION (ORIF) WRIST FRACTURE;  Surgeon: Dominica Severin, MD;  Location: [facility] OR;  Service: Orthopedics;  Laterality: Right;   PATELLA FRACTURE SURGERY     TUBAL LIGATION      Social  History   Socioeconomic History   Marital status: Married    Spouse name: Marcy Salvo   Number of children: 2   Years of education: Not on file   Highest education level: Not on file  Occupational History   Not on file  Tobacco Use   Smoking status: Never   Smokeless tobacco: Never  Vaping Use   Vaping Use: Never used  Substance and Sexual Activity   Alcohol use: No   Drug use: No   Sexual activity: Not on file  Other Topics Concern   Not on file  Social History Narrative   Not on file   Social Determinants of Health   Financial Resource Strain: Not on file  Food Insecurity: Not on file  Transportation Needs: Not on file  Physical Activity: Not on file  Stress: Not on file  Social Connections: Not on file  Intimate Partner Violence: Not on file    Outpatient Encounter Medications as of 08/05/2022  Medication Sig   atorvastatin (LIPITOR) 20 MG tablet Take 1 tablet (20 mg total) by mouth daily.   Cyanocobalamin (VITAMIN B 12 PO) Take by mouth.   Vitamin D, Ergocalciferol, (DRISDOL) 1.25 MG (50000 UNIT) CAPS capsule TAKE 1 CAPSULE (50,000 UNITS TOTAL) BY MOUTH EVERY 7 (SEVEN) DAYS   No facility-administered encounter medications on file as of 08/05/2022.    No Known Allergies  Review of Systems  Constitutional:  Positive for unexpected weight change (Pt states up  4lbs). Negative for activity change, appetite change, chills, fatigue and fever.  HENT: Negative.    Eyes: Negative.  Negative for photophobia and visual disturbance.  Respiratory: Negative.  Negative for cough, chest tightness and shortness of breath.   Cardiovascular: Negative.  Negative for chest pain, palpitations and leg swelling.  Gastrointestinal: Negative.  Negative for blood in stool, constipation, diarrhea, nausea and vomiting.  Endocrine: Negative.  Negative for cold intolerance, heat intolerance, polydipsia, polyphagia and polyuria.  Genitourinary: Negative.  Negative for dysuria, frequency and urgency.   Musculoskeletal: Negative.  Negative for arthralgias and myalgias.  Skin: Negative.   Allergic/Immunologic: Negative.   Neurological: Negative.  Negative for dizziness and headaches.  Hematological: Negative.   Psychiatric/Behavioral: Negative.  Negative for confusion, hallucinations, sleep disturbance and suicidal ideas.   All other systems reviewed and are negative.       Objective:  BP 126/76   Pulse 66   Temp 97.8 F (36.6 C) (Temporal)   Resp 20   Ht 5\' 3"  (1.6 m)   Wt 171 lb (77.6 kg)   SpO2 97%   BMI 30.29 kg/m    Wt Readings from Last 3 Encounters:  08/05/22 171 lb (77.6 kg)  01/31/22 167 lb (75.8 kg)  07/30/21 168 lb (76.2 kg)  08/05/22 Weight 171 lbs  Physical Exam Vitals and nursing note reviewed.  Constitutional:      General: She is not in acute distress.    Appearance: Normal appearance. She is well-developed and well-groomed. She is obese. She is not ill-appearing, toxic-appearing or diaphoretic.  HENT:     Head: Normocephalic and atraumatic.     Jaw: There is normal jaw occlusion.     Right Ear: Hearing normal.     Left Ear: Hearing normal.     Nose: Nose normal.     Mouth/Throat:     Lips: Pink.     Mouth: Mucous membranes are dry.     Pharynx: Oropharynx is clear. Uvula midline.  Eyes:     General: Lids are normal.     Extraocular Movements: Extraocular movements intact.     Conjunctiva/sclera: Conjunctivae normal.     Pupils: Pupils are equal, round, and reactive to light.  Neck:     Thyroid: No thyroid mass, thyromegaly or thyroid tenderness.     Vascular: No carotid bruit or JVD.     Trachea: Trachea and phonation normal.  Cardiovascular:     Rate and Rhythm: Normal rate and regular rhythm.     Chest Wall: PMI is not displaced.     Pulses: Normal pulses.     Heart sounds: Normal heart sounds. No murmur heard.    No friction rub. No gallop.  Pulmonary:     Effort: Pulmonary effort is normal. No respiratory distress.     Breath sounds:  Normal breath sounds. No wheezing.  Abdominal:     General: Bowel sounds are normal. There is no distension or abdominal bruit.     Palpations: Abdomen is soft. There is no hepatomegaly or splenomegaly.     Tenderness: There is no abdominal tenderness. There is no right CVA tenderness or left CVA tenderness.     Hernia: No hernia is present.  Musculoskeletal:        General: Normal range of motion.     Cervical back: Normal range of motion and neck supple.     Right lower leg: No edema.     Left lower leg: No edema.  Lymphadenopathy:  Cervical: No cervical adenopathy.  Skin:    General: Skin is warm and dry.     Capillary Refill: Capillary refill takes less than 2 seconds.     Coloration: Skin is not cyanotic, jaundiced or pale.     Findings: No rash.  Neurological:     General: No focal deficit present.     Mental Status: She is alert and oriented to person, place, and time. Mental status is at baseline.     Sensory: Sensation is intact.     Motor: Motor function is intact.     Coordination: Coordination is intact.     Gait: Gait is intact.     Deep Tendon Reflexes: Reflexes are normal and symmetric.  Psychiatric:        Attention and Perception: Attention and perception normal.        Mood and Affect: Mood and affect normal.        Speech: Speech normal.        Behavior: Behavior normal. Behavior is cooperative.        Thought Content: Thought content normal.        Cognition and Memory: Cognition and memory normal.        Judgment: Judgment normal.     Results for orders placed or performed in visit on 01/31/22  Lipid panel  Result Value Ref Range   Cholesterol, Total 105 100 - 199 mg/dL   Triglycerides 82 0 - 149 mg/dL   HDL 37 (L) >16 mg/dL   VLDL Cholesterol Cal 17 5 - 40 mg/dL   LDL Chol Calc (NIH) 51 0 - 99 mg/dL   Chol/HDL Ratio 2.8 0.0 - 4.4 ratio  CBC with Differential/Platelet  Result Value Ref Range   WBC 5.1 3.4 - 10.8 x10E3/uL   RBC 4.22 3.77 - 5.28  x10E6/uL   Hemoglobin 13.3 11.1 - 15.9 g/dL   Hematocrit 10.9 60.4 - 46.6 %   MCV 91 79 - 97 fL   MCH 31.5 26.6 - 33.0 pg   MCHC 34.6 31.5 - 35.7 g/dL   RDW 54.0 (L) 98.1 - 19.1 %   Platelets 282 150 - 450 x10E3/uL   Neutrophils 49 Not Estab. %   Lymphs 41 Not Estab. %   Monocytes 7 Not Estab. %   Eos 2 Not Estab. %   Basos 1 Not Estab. %   Neutrophils Absolute 2.5 1.4 - 7.0 x10E3/uL   Lymphocytes Absolute 2.1 0.7 - 3.1 x10E3/uL   Monocytes Absolute 0.4 0.1 - 0.9 x10E3/uL   EOS (ABSOLUTE) 0.1 0.0 - 0.4 x10E3/uL   Basophils Absolute 0.0 0.0 - 0.2 x10E3/uL   Immature Granulocytes 0 Not Estab. %   Immature Grans (Abs) 0.0 0.0 - 0.1 x10E3/uL  CMP14+EGFR  Result Value Ref Range   Glucose 87 70 - 99 mg/dL   BUN 15 8 - 27 mg/dL   Creatinine, Ser 4.78 0.57 - 1.00 mg/dL   eGFR 68 >29 FA/OZH/0.86   BUN/Creatinine Ratio 16 12 - 28   Sodium 140 134 - 144 mmol/L   Potassium 3.9 3.5 - 5.2 mmol/L   Chloride 106 96 - 106 mmol/L   CO2 20 20 - 29 mmol/L   Calcium 8.7 8.7 - 10.3 mg/dL   Total Protein 6.7 6.0 - 8.5 g/dL   Albumin 4.3 3.9 - 4.9 g/dL   Globulin, Total 2.4 1.5 - 4.5 g/dL   Albumin/Globulin Ratio 1.8 1.2 - 2.2   Bilirubin Total 0.4 0.0 - 1.2 mg/dL  Alkaline Phosphatase 83 44 - 121 IU/L   AST 22 0 - 40 IU/L   ALT 27 0 - 32 IU/L  Bayer DCA Hb A1c Waived  Result Value Ref Range   HB A1C (BAYER DCA - WAIVED) 6.1 (H) 4.8 - 5.6 %       Pertinent labs & imaging results that were available during my care of the patient were reviewed by me and considered in my medical decision making.  Assessment & Plan:  Nyella was seen today for medical management of chronic issues.  Diagnoses and all orders for this visit:  . Prediabetes A1C 6.1 Continue to work on diet and exercise. Will repeat in 6 months. Other labs pending.  -     Bayer DCA Hb A1c Waived -     CBC with Differential/Platelet -     CMP14+EGFR -     Lipid panel -     Thyroid Panel With TSH  2. Mixed  hyperlipidemia Diet encouraged - increase intake of fresh fruits and vegetables, increase intake of lean proteins. Bake, broil, or grill foods. Avoid fried, greasy, and fatty foods. Avoid fast foods. Increase intake of fiber-rich whole grains. Exercise encouraged - at least 150 minutes per week and advance as tolerated.  Goal BMI < 25. Continue medications as prescribed. Follow up in 6 months as discussed.   -     CMP14+EGFR -     Lipid panel  3. BMI 31.2 ,adult Diet encouraged - increase intake of fresh fruits and vegetables, increase intake of lean proteins. Bake, broil, or grill foods. Avoid fried, greasy, and fatty foods. Avoid fast foods. Increase intake of fiber-rich whole grains. Exercise encouraged - at least 150 minutes per week and advance as tolerated.  Goal BMI < 25. Continue medications as prescribed.   4. Vitamin D deficiency Labs pending. Continue repletion therapy. If indicated, will change repletion dosage. Eat foods rich in Vit D including milk, orange juice, yogurt with vitamin D added, salmon or mackerel, canned tuna fish, cereals with vitamin D added, and cod liver oil. Get out in the sun but make sure to wear at least SPF 30 sunscreen.   -     CMP14+EGFR -     VITAMIN D 25 Hydroxy (Vit-D Deficiency, Fractures)  Continue all other maintenance medications.  Follow up plan:  Return to clinic in 6 months   1Continue healthy lifestyle choices, including diet (rich in fruits, vegetables, and lean proteins, and low in salt and simple carbohydrates) and exercise (at least 30 minutes of moderate physical activity daily).  Labs Drrawn -     Bayer DCA Hb A1c Waived -     CBC with Differential/Platelet -     CMP14+EGFR -     Lipid panel -     Thyroid Panel With TSH  Educational handout given for DASH and HTN  The above assessment and management plan was discussed with the patient. The patient verbalized understanding of and has agreed to the management plan. Patient is aware  to call the clinic if they develop any new symptoms or if symptoms persist or worsen. Patient is aware when to return to the clinic for a follow-up visit. Patient educated on when it is appropriate to go to the emergency department.   Maryelizabeth Kaufmann AGNP student [facility] [phone]  I personally was present during the history, physical exam, and medical decision-making activities of this visit and have verified that the services and findings are accurately  documented in the nurse practitioner student's note.  Kari Baars, FNP-C [facility] [address] [place], [place] [address] [phone]

## 2022-08-06 LAB — CMP14+EGFR
ALT: 34 IU/L — ABNORMAL HIGH (ref 0–32)
AST: 24 IU/L (ref 0–40)
Albumin/Globulin Ratio: 1.6 (ref 1.2–2.2)
Albumin: 4.4 g/dL (ref 3.9–4.9)
Alkaline Phosphatase: 115 IU/L (ref 44–121)
BUN/Creatinine Ratio: 18 (ref 12–28)
BUN: 19 mg/dL (ref 8–27)
Bilirubin Total: 0.6 mg/dL (ref 0.0–1.2)
CO2: 22 mmol/L (ref 20–29)
Calcium: 9.6 mg/dL (ref 8.7–10.3)
Chloride: 104 mmol/L (ref 96–106)
Creatinine, Ser: 1.07 mg/dL — ABNORMAL HIGH (ref 0.57–1.00)
Globulin, Total: 2.8 g/dL (ref 1.5–4.5)
Glucose: 97 mg/dL (ref 70–99)
Potassium: 4.3 mmol/L (ref 3.5–5.2)
Sodium: 141 mmol/L (ref 134–144)
Total Protein: 7.2 g/dL (ref 6.0–8.5)
eGFR: 58 mL/min/{1.73_m2} — ABNORMAL LOW (ref >59)

## 2022-08-06 LAB — CBC WITH DIFFERENTIAL/PLATELET
Basophils Absolute: 0 10*3/uL (ref 0.0–0.2)
Basos: 1 %
EOS (ABSOLUTE): 0.1 10*3/uL (ref 0.0–0.4)
Eos: 2 %
Hematocrit: 42 % (ref 34.0–46.6)
Hemoglobin: 14.3 g/dL (ref 11.1–15.9)
Immature Grans (Abs): 0 10*3/uL (ref 0.0–0.1)
Immature Granulocytes: 0 %
Lymphocytes Absolute: 2.2 10*3/uL (ref 0.7–3.1)
Lymphs: 40 %
MCH: 31.8 pg (ref 26.6–33.0)
MCHC: 34 g/dL (ref 31.5–35.7)
MCV: 93 fL (ref 79–97)
Monocytes Absolute: 0.4 10*3/uL (ref 0.1–0.9)
Monocytes: 7 %
Neutrophils Absolute: 2.8 10*3/uL (ref 1.4–7.0)
Neutrophils: 50 %
Platelets: 286 10*3/uL (ref 150–450)
RBC: 4.5 x10E6/uL (ref 3.77–5.28)
RDW: 12 % (ref 11.7–15.4)
WBC: 5.5 10*3/uL (ref 3.4–10.8)

## 2022-08-06 LAB — LIPID PANEL
Chol/HDL Ratio: 2.9 ratio (ref 0.0–4.4)
Cholesterol, Total: 182 mg/dL (ref 100–199)
HDL: 62 mg/dL (ref >39)
LDL Chol Calc (NIH): 96 mg/dL (ref 0–99)
Triglycerides: 135 mg/dL (ref 0–149)
VLDL Cholesterol Cal: 24 mg/dL (ref 5–40)

## 2022-08-06 LAB — THYROID PANEL WITH TSH
Free Thyroxine Index: 1.9 (ref 1.2–4.9)
T3 Uptake Ratio: 23 % — ABNORMAL LOW (ref 24–39)
T4, Total: 8.4 ug/dL (ref 4.5–12.0)
TSH: 2.28 u[IU]/mL (ref 0.450–4.500)

## 2022-08-06 LAB — VITAMIN D 25 HYDROXY (VIT D DEFICIENCY, FRACTURES): Vit D, 25-Hydroxy: 47.8 ng/mL (ref 30.0–100.0)

## 2022-09-09 ENCOUNTER — Encounter: Payer: Self-pay | Admitting: *Deleted

## 2022-09-25 IMAGING — CT CT CHEST W/O CM
2 of 4 series · 15 of 36 positions shown, 18 images · non-contrast
Comparison: Chest radiographs 11/03/2014

CLINICAL DATA: Lung nodule.



[Series 2: routine chest without · axial · non-contrast · 0.76mm/px · z∈[+1232,+1466]mm · 12 of 139 slices shown, 15 images]
[im 11/139  mediastinal]
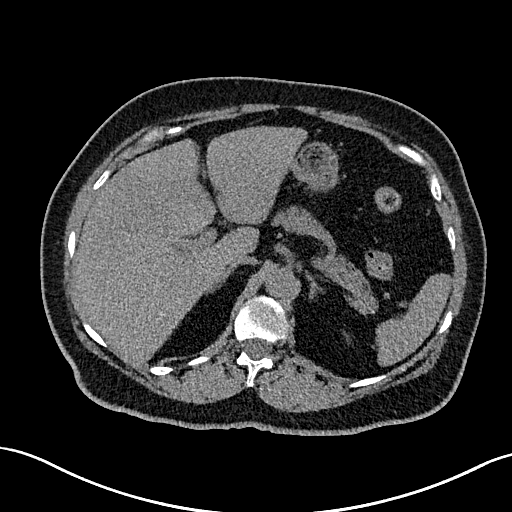
[im 11/139  lung]
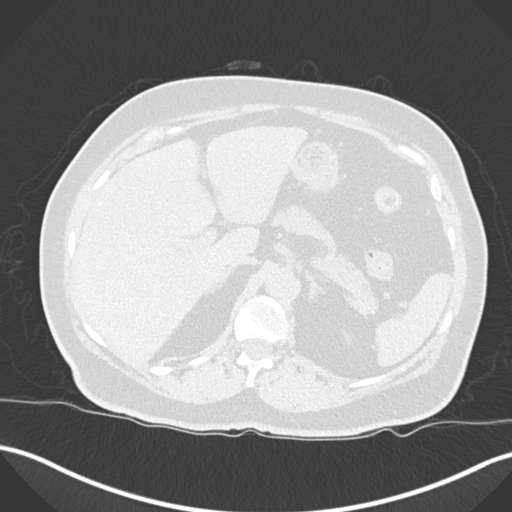
[im 22/139  lung]
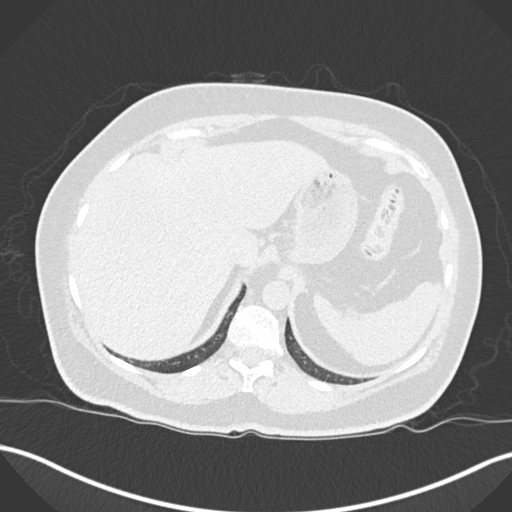
[im 32/139  lung]
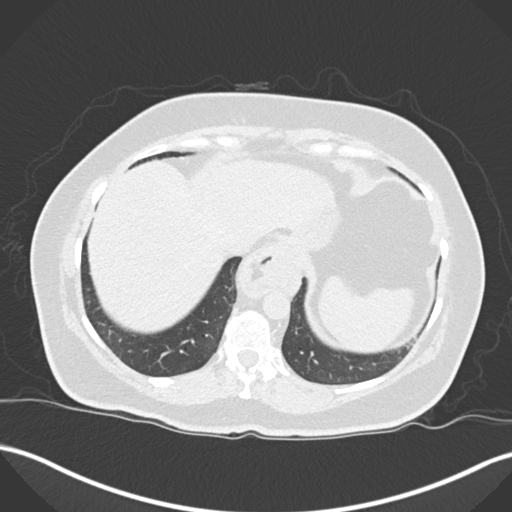
[im 43/139  lung]
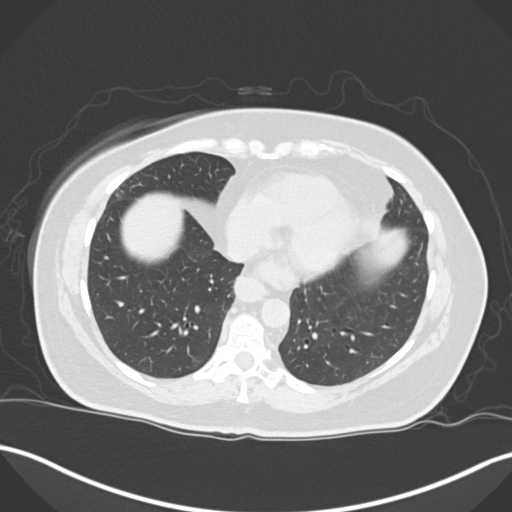
[im 54/139  mediastinal]
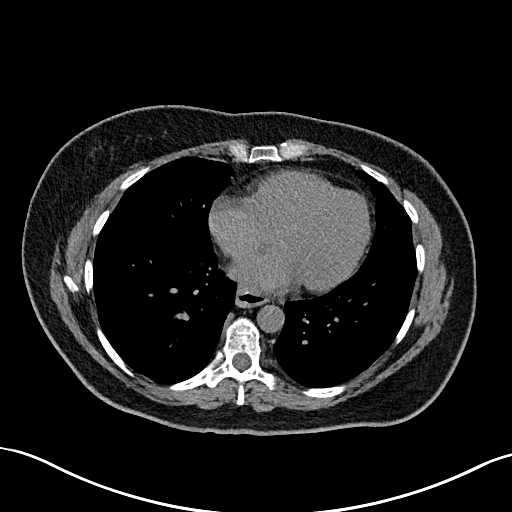
[im 54/139  lung]
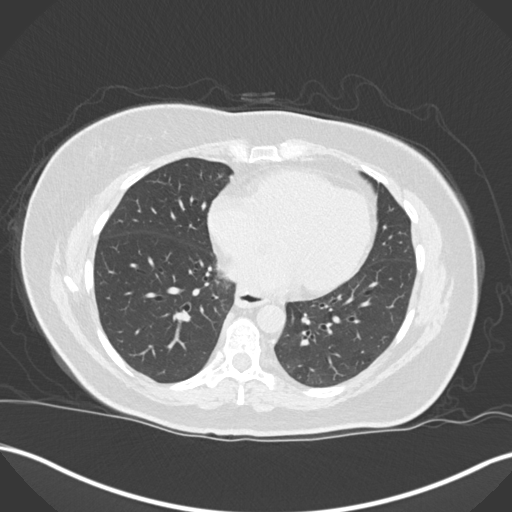
[im 64/139  lung]
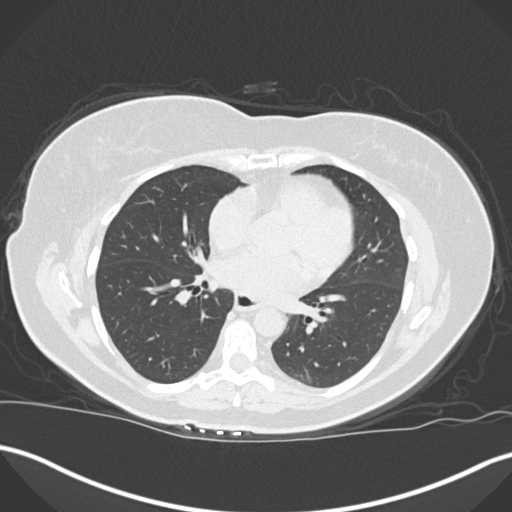
[im 75/139  lung]
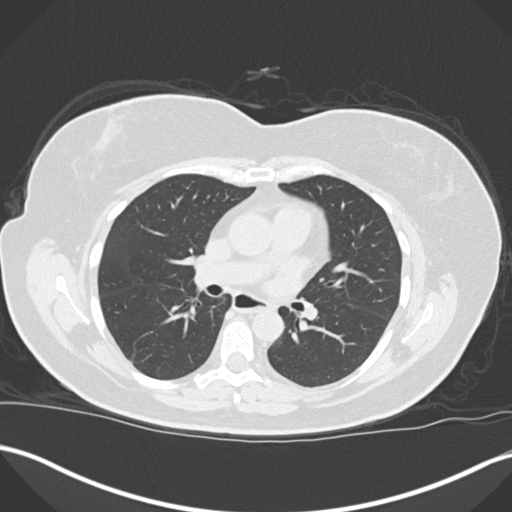
[im 85/139  lung]
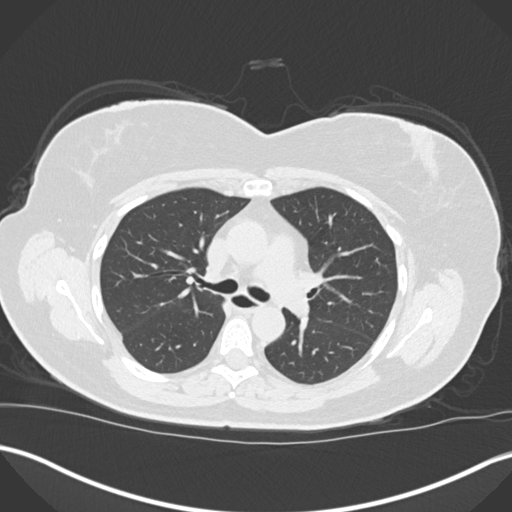
[im 96/139  mediastinal]
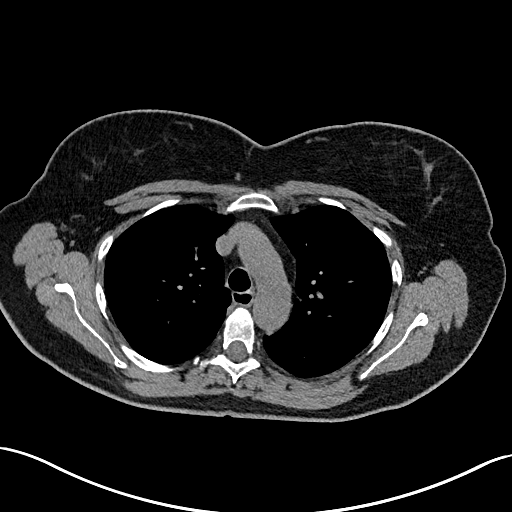
[im 96/139  lung]
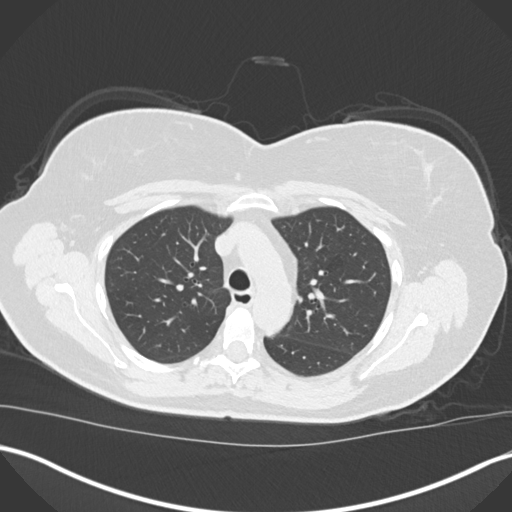
[im 107/139  lung]
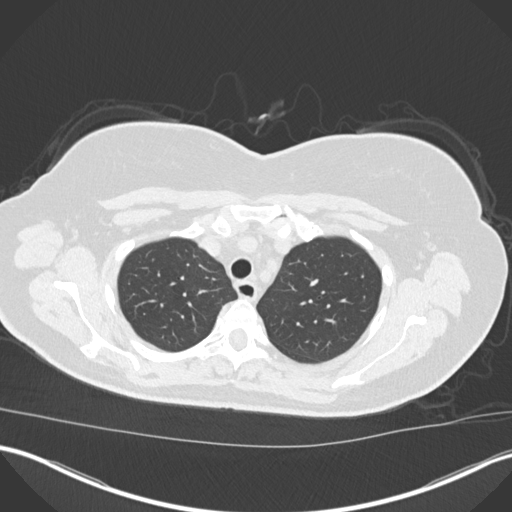
[im 117/139  lung]
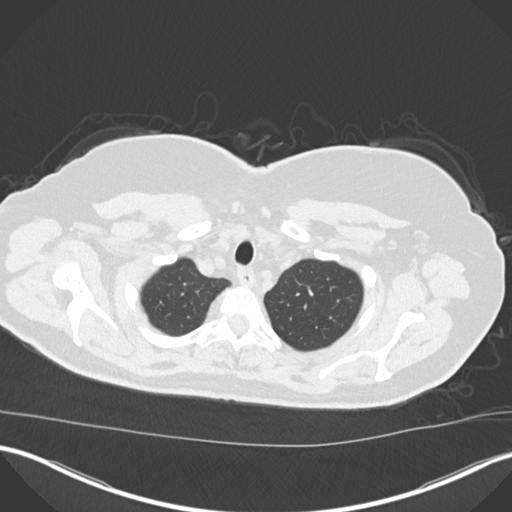
[im 128/139  lung]
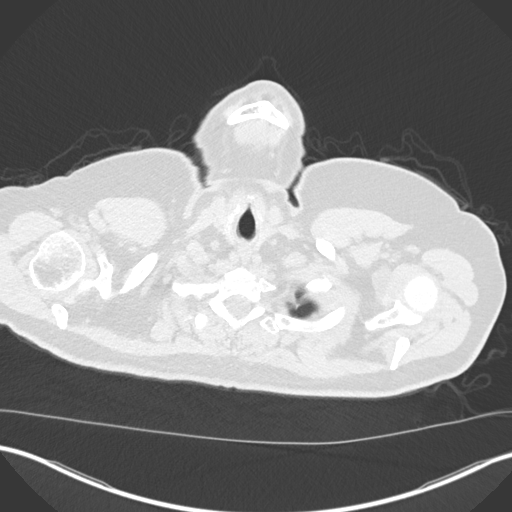

[Series 5: coronal · coronal · 0.59mm/px · 3 of 151 slices shown]
[im 31/151  lung]
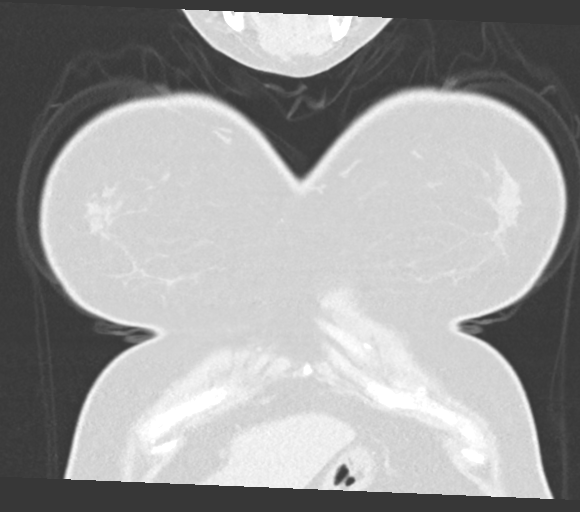
[im 61/151  lung]
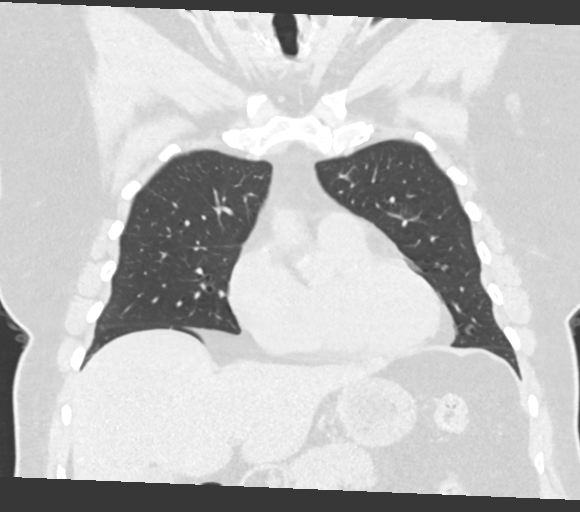
[im 91/151  lung]
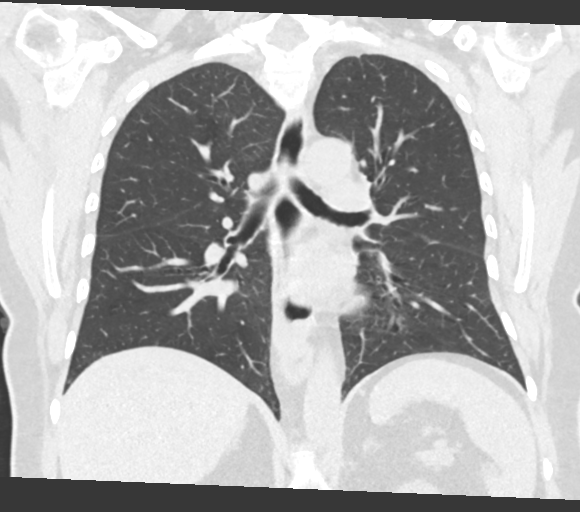

[15 of 36 positions shown; findings below may reference images not displayed]

FINDINGS: Cardiovascular: Normal caliber of the thoracic aorta. Normal heart
size. No pericardial effusion. Mild LAD coronary atherosclerotic
calcification.

Mediastinum/Nodes: No enlarged axillary, mediastinal, or hilar lymph
nodes within limitations of noncontrast technique. Small to
moderate-sized sliding hiatal hernia. Unremarkable thyroid.

Lungs/Pleura: No pleural effusion or pneumothorax. 3 mm right upper
lobe nodule (series 4, image 36). Minimal scarring in the superior
segment of the right lower lobe.

Upper Abdomen: No acute abnormality.

Musculoskeletal: No acute osseous abnormality or suspicious osseous
lesion.
IMPRESSION: 1. 3 mm right upper lobe pulmonary nodule. If patient is low risk
for malignancy, no routine follow-up imaging is recommended; if
patient is high risk for malignancy, a non-contrast Chest CT at 12
months is optional. If performed and the nodule is stable at 12
months, no further follow-up is recommended.
These guidelines do not apply to immunocompromised patients and
patients with cancer. Follow up in patients with significant
comorbidities as clinically warranted. For lung cancer screening,
adhere to Lung-RADS guidelines. Reference: Radiology. 6508;
2. Small to moderate-sized sliding hiatal hernia.
3. Coronary atherosclerosis.

## 2022-10-08 ENCOUNTER — Other Ambulatory Visit: Payer: Self-pay | Admitting: Family Medicine

## 2022-10-08 DIAGNOSIS — E559 Vitamin D deficiency, unspecified: Secondary | ICD-10-CM

## 2022-11-12 ENCOUNTER — Ambulatory Visit (INDEPENDENT_AMBULATORY_CARE_PROVIDER_SITE_OTHER): Payer: 59 | Admitting: Family Medicine

## 2022-11-12 VITALS — BP 127/73 | HR 66 | Temp 96.3°F | Ht 63.0 in | Wt 173.6 lb

## 2022-11-12 DIAGNOSIS — M25551 Pain in right hip: Secondary | ICD-10-CM

## 2022-11-12 DIAGNOSIS — Z23 Encounter for immunization: Secondary | ICD-10-CM | POA: Diagnosis not present

## 2022-11-12 MED ORDER — DICLOFENAC SODIUM 1 % EX GEL: 2.0000 g | Freq: Four times a day (QID) | CUTANEOUS | 1 refills | Status: DC

## 2022-11-12 NOTE — Progress Notes (Signed)
Subjective:  Patient ID: Alexis Sandoval, female    DOB: [birth year], 62 y.o.   MRN: [id number]  Patient Care Team: Sonny Masters, FNP as PCP - General (Family Medicine)   Chief Complaint:  Follow-up (Urgent care follow up - States she went to [facility] on 9/2. Right hip has gotten better- would like a work note for light duty at work.)   HPI: Alexis Sandoval is a 62 y.o. female presenting on 11/12/2022 for Follow-up (Urgent care follow up - States she went to [facility] on 9/2. Right hip has gotten better- would like a work note for light duty at work.)   Pt had a fall on 11/03/2022. She was seen at [facility] after the fall for right hip pain. She was given a Toradol shot and told to use ice/heat. 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 She is able to bear weight and walk without pain.   Hip Pain  The incident occurred more than 1 week ago. The injury mechanism was a fall. The pain is present in the right hip. The pain is mild. The pain has been Intermittent since onset. Pertinent negatives include no inability to bear weight, loss of motion, loss of sensation, muscle weakness, numbness or tingling. She reports no foreign bodies present. Exacerbated by: weight bearing for long hours. She has tried acetaminophen, heat, ice and rest for the symptoms. The treatment provided significant relief.       Relevant past medical, surgical, family, and social history reviewed and updated as indicated.  Allergies and medications reviewed and updated. Data reviewed: Chart in Epic.   Past Medical History:  Diagnosis Date   GERD (gastroesophageal reflux disease)    does not take anything for it    Past Surgical History:  Procedure Laterality Date   BIOPSY  04/26/2021   Procedure: BIOPSY;  Surgeon: Lanelle Bal, DO;  Location: [facility];  Service: Endoscopy;;   COLONOSCOPY WITH PROPOFOL N/A 04/26/2021    Procedure: COLONOSCOPY WITH PROPOFOL;  Surgeon: Lanelle Bal, DO;  Location: [facility];  Service: Endoscopy;  Laterality: N/A;  1:15 / ASA 2   ORIF WRIST FRACTURE Right 11/04/2014   Procedure: OPEN REDUCTION INTERNAL FIXATION (ORIF) WRIST FRACTURE;  Surgeon: Dominica Severin, MD;  Location: [facility] OR;  Service: Orthopedics;  Laterality: Right;   PATELLA FRACTURE SURGERY     TUBAL LIGATION      Social History   Socioeconomic History   Marital status: Married    Spouse name: Marcy Salvo   Number of children: 2   Years of education: Not on file   Highest education level: Associate degree: occupational, Scientist, product/process development, or vocational program  Occupational History   Not on file  Tobacco Use   Smoking status: Never   Smokeless tobacco: Never  Vaping Use   Vaping status: Never Used  Substance and Sexual Activity   Alcohol use: No   Drug use: No   Sexual activity: Not on file  Other Topics Concern   Not on file  Social History Narrative   Not on file   Social Determinants of Health   Financial Resource Strain: Patient Declined (11/12/2022)   Overall Financial Resource Strain (CARDIA)    Difficulty of Paying Living Expenses: Patient declined  Food Insecurity: No Food Insecurity (11/12/2022)   Hunger Vital Sign    Worried About Running Out of Food in the Last Year:  Never true    Ran Out of Food in the Last Year: Never true  Transportation Needs: No Transportation Needs (11/12/2022)   PRAPARE - Administrator, Civil Service (Medical): No    Lack of Transportation (Non-Medical): No  Physical Activity: Insufficiently Active (11/12/2022)   Exercise Vital Sign    Days of Exercise per Week: 7 days    Minutes of Exercise per Session: 10 min  Stress: No Stress Concern Present (11/12/2022)   [employer] of Occupational Health - Occupational Stress Questionnaire    Feeling of Stress : Only a little  Social Connections: Socially Integrated (11/12/2022)   Social Connection and  Isolation Panel [NHANES]    Frequency of Communication with Friends and Family: More than three times a week    Frequency of Social Gatherings with Friends and Family: Once a week    Attends Religious Services: More than 4 times per year    Active Member of [employer] or Organizations: Yes    Attends Engineer, structural: More than 4 times per year    Marital Status: Married  Catering manager Violence: Not At Risk (12/03/2021)   Received from [facility]   Humiliation, Afraid, Rape, and Kick questionnaire    Fear of Current or Ex-Partner: No    Emotionally Abused: No    Physically Abused: No    Sexually Abused: No    Outpatient Encounter Medications as of 11/12/2022  Medication Sig   atorvastatin (LIPITOR) 20 MG tablet Take 1 tablet (20 mg total) by mouth daily.   Cyanocobalamin (VITAMIN B 12 PO) Take by mouth.   diclofenac Sodium (VOLTAREN) 1 % GEL Apply 2 g topically 4 (four) times daily.   Vitamin D, Ergocalciferol, (DRISDOL) 1.25 MG (50000 UNIT) CAPS capsule TAKE 1 CAPSULE (50,000 UNITS TOTAL) BY MOUTH EVERY 7 (SEVEN) DAYS   No facility-administered encounter medications on file as of 11/12/2022.    No Known Allergies  Review of Systems  Constitutional:  Negative for activity change, appetite change, chills, diaphoresis, fatigue, fever and unexpected weight change.  HENT: Negative.    Eyes: Negative.  Negative for photophobia and visual disturbance.  Respiratory:  Negative for cough, chest tightness and shortness of breath.   Cardiovascular:  Negative for chest pain, palpitations and leg swelling.  Gastrointestinal:  Negative for abdominal pain, blood in stool, constipation, diarrhea, nausea and vomiting.  Endocrine: Negative.   Genitourinary:  Negative for decreased urine volume, difficulty urinating, dysuria, frequency and urgency.  Musculoskeletal:  Positive for arthralgias. Negative for back pain, gait problem, joint swelling, myalgias, neck pain and  neck stiffness.  Skin: Negative.   Allergic/Immunologic: Negative.   Neurological:  Negative for dizziness, tingling, tremors, seizures, syncope, facial asymmetry, speech difficulty, weakness, light-headedness, numbness and headaches.  Hematological: Negative.   Psychiatric/Behavioral:  Negative for confusion, hallucinations, sleep disturbance and suicidal ideas.   All other systems reviewed and are negative.       Objective:  BP 127/73   Pulse 66   Temp (!) 96.3 F (35.7 C) (Temporal)   Ht 5\' 3"  (1.6 m)   Wt 173 lb 9.6 oz (78.7 kg)   SpO2 96%   BMI 30.75 kg/m    Wt Readings from Last 3 Encounters:  11/12/22 173 lb 9.6 oz (78.7 kg)  08/05/22 171 lb (77.6 kg)  01/31/22 167 lb (75.8 kg)    Physical Exam Vitals and nursing note reviewed.  Constitutional:      General: She is  not in acute distress.    Appearance: Normal appearance. She is obese. She is not ill-appearing, toxic-appearing or diaphoretic.  HENT:     Head: Normocephalic and atraumatic.     Nose: Nose normal.     Mouth/Throat:     Mouth: Mucous membranes are moist.  Eyes:     Conjunctiva/sclera: Conjunctivae normal.     Pupils: Pupils are equal, round, and reactive to light.  Cardiovascular:     Rate and Rhythm: Normal rate and regular rhythm.     Heart sounds: Normal heart sounds.  Pulmonary:     Effort: Pulmonary effort is normal.     Breath sounds: Normal breath sounds.  Musculoskeletal:        General: Normal range of motion.     Lumbar back: Normal.     Right hip: Tenderness present. No deformity, lacerations, bony tenderness or crepitus. Normal range of motion. Normal strength.     Right upper leg: Normal.     Left upper leg: Normal.     Right lower leg: No edema.     Left lower leg: No edema.       Legs:  Skin:    General: Skin is warm and dry.     Capillary Refill: Capillary refill takes less than 2 seconds.  Neurological:     General: No focal deficit present.     Mental Status: She is  alert and oriented to person, place, and time.  Psychiatric:        Mood and Affect: Mood normal.        Behavior: Behavior normal.        Thought Content: Thought content normal.        Judgment: Judgment normal.     Results for orders placed or performed in visit on 08/05/22  Bayer DCA Hb A1c Waived  Result Value Ref Range   HB A1C (BAYER DCA - WAIVED) 6.1 (H) 4.8 - 5.6 %  CBC with Differential/Platelet  Result Value Ref Range   WBC 5.5 3.4 - 10.8 x10E3/uL   RBC 4.50 3.77 - 5.28 x10E6/uL   Hemoglobin 14.3 11.1 - 15.9 g/dL   Hematocrit 16.1 09.6 - 46.6 %   MCV 93 79 - 97 fL   MCH 31.8 26.6 - 33.0 pg   MCHC 34.0 31.5 - 35.7 g/dL   RDW 04.5 40.9 - 81.1 %   Platelets 286 150 - 450 x10E3/uL   Neutrophils 50 Not Estab. %   Lymphs 40 Not Estab. %   Monocytes 7 Not Estab. %   Eos 2 Not Estab. %   Basos 1 Not Estab. %   Neutrophils Absolute 2.8 1.4 - 7.0 x10E3/uL   Lymphocytes Absolute 2.2 0.7 - 3.1 x10E3/uL   Monocytes Absolute 0.4 0.1 - 0.9 x10E3/uL   EOS (ABSOLUTE) 0.1 0.0 - 0.4 x10E3/uL   Basophils Absolute 0.0 0.0 - 0.2 x10E3/uL   Immature Granulocytes 0 Not Estab. %   Immature Grans (Abs) 0.0 0.0 - 0.1 x10E3/uL  CMP14+EGFR  Result Value Ref Range   Glucose 97 70 - 99 mg/dL   BUN 19 8 - 27 mg/dL   Creatinine, Ser 9.14 (H) 0.57 - 1.00 mg/dL   eGFR 58 (L) >78 GN/FAO/1.30   BUN/Creatinine Ratio 18 12 - 28   Sodium 141 134 - 144 mmol/L   Potassium 4.3 3.5 - 5.2 mmol/L   Chloride 104 96 - 106 mmol/L   CO2 22 20 - 29 mmol/L   Calcium 9.6 8.7 -  10.3 mg/dL   Total Protein 7.2 6.0 - 8.5 g/dL   Albumin 4.4 3.9 - 4.9 g/dL   Globulin, Total 2.8 1.5 - 4.5 g/dL   Albumin/Globulin Ratio 1.6 1.2 - 2.2   Bilirubin Total 0.6 0.0 - 1.2 mg/dL   Alkaline Phosphatase 115 44 - 121 IU/L   AST 24 0 - 40 IU/L   ALT 34 (H) 0 - 32 IU/L  Lipid panel  Result Value Ref Range   Cholesterol, Total 182 100 - 199 mg/dL   Triglycerides 409 0 - 149 mg/dL   HDL 62 >81 mg/dL   VLDL Cholesterol  Cal 24 5 - 40 mg/dL   LDL Chol Calc (NIH) 96 0 - 99 mg/dL   Chol/HDL Ratio 2.9 0.0 - 4.4 ratio  Thyroid Panel With TSH  Result Value Ref Range   TSH 2.280 0.450 - 4.500 uIU/mL   T4, Total 8.4 4.5 - 12.0 ug/dL   T3 Uptake Ratio 23 (L) 24 - 39 %   Free Thyroxine Index 1.9 1.2 - 4.9  VITAMIN D 25 Hydroxy (Vit-D Deficiency, Fractures)  Result Value Ref Range   Vit D, 25-Hydroxy 47.8 30.0 - 100.0 ng/mL       Pertinent labs & imaging results that were available during my care of the patient were reviewed by me and considered in my medical decision making.  Assessment & Plan:  Zarli was seen today for follow-up.  Diagnoses and all orders for this visit:  Acute right hip pain Full ROM without pain. Ambulating without difficulty. No indications for imaging. Symptomatic care discussed in detail. Aware to use below for the next week. Report new, worsening, or persistent symptoms.  -     diclofenac Sodium (VOLTAREN) 1 % GEL; Apply 2 g topically 4 (four) times daily.     Continue all other maintenance medications.  Follow up plan: Return if symptoms worsen or fail to improve.   Continue healthy lifestyle choices, including diet (rich in fruits, vegetables, and lean proteins, and low in salt and simple carbohydrates) and exercise (at least 30 minutes of moderate physical activity daily).  Educational handout given for hip pain  The above assessment and management plan was discussed with the patient. The patient verbalized understanding of and has agreed to the management plan. Patient is aware to call the clinic if they develop any new symptoms or if symptoms persist or worsen. Patient is aware when to return to the clinic for a follow-up visit. Patient educated on when it is appropriate to go to the emergency department.   Kari Baars, FNP-C [facility] [phone]

## 2022-11-25 ENCOUNTER — Telehealth: Payer: Self-pay | Admitting: Family Medicine

## 2022-11-25 ENCOUNTER — Encounter: Payer: Self-pay | Admitting: Family Medicine

## 2022-11-25 NOTE — Telephone Encounter (Signed)
Letter faxed - left detailed message on vm.

## 2022-11-25 NOTE — Telephone Encounter (Signed)
Patient needs a letter written to be released to go back to work with no restrictions. Please fax to [phone] Attn. Amy.   Stated that she also needed documentation faxed stating that she had her flu shot.   Per patient she needs this done by 9/25.

## 2022-11-25 NOTE — Telephone Encounter (Signed)
Letter printed and placed at front.

## 2022-11-29 ENCOUNTER — Other Ambulatory Visit: Payer: Self-pay | Admitting: Family Medicine

## 2022-11-29 DIAGNOSIS — E559 Vitamin D deficiency, unspecified: Secondary | ICD-10-CM

## 2023-02-03 ENCOUNTER — Encounter: Payer: Self-pay | Admitting: Family Medicine

## 2023-02-03 ENCOUNTER — Ambulatory Visit (INDEPENDENT_AMBULATORY_CARE_PROVIDER_SITE_OTHER): Payer: 59 | Admitting: Family Medicine

## 2023-02-03 VITALS — BP 139/75 | HR 66 | Temp 97.5°F | Ht 63.0 in | Wt 174.6 lb

## 2023-02-03 DIAGNOSIS — R7303 Prediabetes: Secondary | ICD-10-CM

## 2023-02-03 DIAGNOSIS — M8589 Other specified disorders of bone density and structure, multiple sites: Secondary | ICD-10-CM

## 2023-02-03 DIAGNOSIS — E782 Mixed hyperlipidemia: Secondary | ICD-10-CM

## 2023-02-03 DIAGNOSIS — Z1231 Encounter for screening mammogram for malignant neoplasm of breast: Secondary | ICD-10-CM

## 2023-02-03 DIAGNOSIS — E559 Vitamin D deficiency, unspecified: Secondary | ICD-10-CM | POA: Diagnosis not present

## 2023-02-03 DIAGNOSIS — R252 Cramp and spasm: Secondary | ICD-10-CM

## 2023-02-03 DIAGNOSIS — N289 Disorder of kidney and ureter, unspecified: Secondary | ICD-10-CM

## 2023-02-03 LAB — CMP14+EGFR
ALT: 27 [IU]/L (ref 0–32)
AST: 20 [IU]/L (ref 0–40)
Albumin: 4.3 g/dL (ref 3.9–4.9)
Alkaline Phosphatase: 99 [IU]/L (ref 44–121)
BUN/Creatinine Ratio: 15 (ref 12–28)
BUN: 16 mg/dL (ref 8–27)
Bilirubin Total: 0.6 mg/dL (ref 0.0–1.2)
CO2: 21 mmol/L (ref 20–29)
Calcium: 9.3 mg/dL (ref 8.7–10.3)
Chloride: 105 mmol/L (ref 96–106)
Creatinine, Ser: 1.05 mg/dL — ABNORMAL HIGH (ref 0.57–1.00)
Globulin, Total: 2.7 g/dL (ref 1.5–4.5)
Glucose: 107 mg/dL — ABNORMAL HIGH (ref 70–99)
Potassium: 4.5 mmol/L (ref 3.5–5.2)
Sodium: 142 mmol/L (ref 134–144)
Total Protein: 7 g/dL (ref 6.0–8.5)
eGFR: 59 mL/min/{1.73_m2} — ABNORMAL LOW (ref >59)

## 2023-02-03 LAB — BAYER DCA HB A1C WAIVED: HB A1C (BAYER DCA - WAIVED): 6.2 % — ABNORMAL HIGH (ref 4.8–5.6)

## 2023-02-03 MED ORDER — VITAMIN D (ERGOCALCIFEROL) 1.25 MG (50000 UNIT) PO CAPS: 50000.0000 [IU] | ORAL_CAPSULE | ORAL | 1 refills | Status: DC

## 2023-02-03 MED ORDER — ATORVASTATIN CALCIUM 20 MG PO TABS: 20.0000 mg | ORAL_TABLET | Freq: Every day | ORAL | 2 refills | Status: DC

## 2023-02-03 NOTE — Progress Notes (Signed)
Subjective:  Patient ID: Alexis Sandoval, female    DOB: 1960/08/16, 62 y.o.   MRN: [id number]  Patient Care Team: Sonny Masters, FNP as PCP - General (Family Medicine)   Chief Complaint:  Prediabetes (6 month follow up ) and Arm Pain (Left x 2 weeks )   HPI: Alexis Sandoval is a 62 y.o. female presenting on 02/03/2023 for Prediabetes (6 month follow up ) and Arm Pain (Left x 2 weeks )   Discussed the use of AI scribe software for clinical note transcription with the patient, who gave verbal consent to proceed.  History of Present Illness   The patient, with a history of prediabetes, hyperlipidemia, and vitamin D deficiency, presents for a six-month follow-up. They report experiencing sharp, intermittent pains in the left arm and finger joints, which have been occurring since the onset of cold weather. The pain is not consistent, occurring approximately once every other week, and has lasted up to thirty minutes at its longest. During one episode, the patient noticed significant shaking in their hand. They deny any recent falls or injuries since a previous incident that resulted in back pain.  The patient also reports a recent increase in weight, attributing it to decreased physical activity due to cold weather and residual back pain from a previous fall. They experienced leg cramps in the right leg, which were unusual and occurred twice. The patient managed these cramps through leg exercises.  Regarding their prediabetes, the patient denies any increased thirst, hunger, or urination.   They have been adhering to their medication regimen, which includes atorvastatin and weekly vitamin D supplements. However, they admit to being less consistent with their daily vitamin B12 supplement.  The patient has been proactive about their health, having received a flu shot during their last visit and is due for a mammogram. They are also due for a DEXA scan next year. They have been managing their  symptoms without additional medication, as the pain in their arm typically subsides quickly.          Relevant past medical, surgical, family, and social history reviewed and updated as indicated.  Allergies and medications reviewed and updated. Data reviewed: Chart in Epic.   Past Medical History:  Diagnosis Date   GERD (gastroesophageal reflux disease)    does not take anything for it    Past Surgical History:  Procedure Laterality Date   BIOPSY  04/26/2021   Procedure: BIOPSY;  Surgeon: Lanelle Bal, DO;  Location: [facility];  Service: Endoscopy;;   COLONOSCOPY WITH PROPOFOL N/A 04/26/2021   Procedure: COLONOSCOPY WITH PROPOFOL;  Surgeon: Lanelle Bal, DO;  Location: [facility];  Service: Endoscopy;  Laterality: N/A;  1:15 / ASA 2   ORIF WRIST FRACTURE Right 11/04/2014   Procedure: OPEN REDUCTION INTERNAL FIXATION (ORIF) WRIST FRACTURE;  Surgeon: Dominica Severin, MD;  Location: [facility] OR;  Service: Orthopedics;  Laterality: Right;   PATELLA FRACTURE SURGERY     TUBAL LIGATION      Social History   Socioeconomic History   Marital status: Married    Spouse name: Marcy Salvo   Number of children: 2   Years of education: Not on file   Highest education level: Associate degree: occupational, Scientist, product/process development, or vocational program  Occupational History   Not on file  Tobacco Use   Smoking status: Never   Smokeless tobacco: Never  Vaping Use   Vaping status: Never Used  Substance and Sexual Activity  Alcohol use: No   Drug use: No   Sexual activity: Not on file  Other Topics Concern   Not on file  Social History Narrative   Not on file   Social Determinants of Health   Financial Resource Strain: Patient Declined (11/12/2022)   Overall Financial Resource Strain (CARDIA)    Difficulty of Paying Living Expenses: Patient declined  Food Insecurity: No Food Insecurity (11/12/2022)   Hunger Vital Sign    Worried About Running Out of Food in the Last Year: Never true     Ran Out of Food in the Last Year: Never true  Transportation Needs: No Transportation Needs (11/12/2022)   PRAPARE - Administrator, Civil Service (Medical): No    Lack of Transportation (Non-Medical): No  Physical Activity: Insufficiently Active (11/12/2022)   Exercise Vital Sign    Days of Exercise per Week: 7 days    Minutes of Exercise per Session: 10 min  Stress: No Stress Concern Present (11/12/2022)   [employer] of Occupational Health - Occupational Stress Questionnaire    Feeling of Stress : Only a little  Social Connections: Socially Integrated (11/12/2022)   Social Connection and Isolation Panel [NHANES]    Frequency of Communication with Friends and Family: More than three times a week    Frequency of Social Gatherings with Friends and Family: Once a week    Attends Religious Services: More than 4 times per year    Active Member of [employer] or Organizations: Yes    Attends Engineer, structural: More than 4 times per year    Marital Status: Married  Catering manager Violence: Not At Risk (12/03/2021)   Received from [facility]   Humiliation, Afraid, Rape, and Kick questionnaire    Fear of Current or Ex-Partner: No    Emotionally Abused: No    Physically Abused: No    Sexually Abused: No    Outpatient Encounter Medications as of 02/03/2023  Medication Sig   atorvastatin (LIPITOR) 20 MG tablet Take 1 tablet (20 mg total) by mouth daily.   Cyanocobalamin (VITAMIN B 12 PO) Take by mouth.   diclofenac Sodium (VOLTAREN) 1 % GEL Apply 2 g topically 4 (four) times daily.   Vitamin D, Ergocalciferol, (DRISDOL) 1.25 MG (50000 UNIT) CAPS capsule Take 1 capsule (50,000 Units total) by mouth every 7 (seven) days.   [DISCONTINUED] atorvastatin (LIPITOR) 20 MG tablet Take 1 tablet (20 mg total) by mouth daily.   [DISCONTINUED] Vitamin D, Ergocalciferol, (DRISDOL) 1.25 MG (50000 UNIT) CAPS capsule TAKE 1 CAPSULE (50,000 UNITS TOTAL) BY MOUTH EVERY  7 (SEVEN) DAYS   No facility-administered encounter medications on file as of 02/03/2023.    No Known Allergies  Pertinent ROS per HPI, otherwise unremarkable      Objective:  BP 139/75   Pulse 66   Temp (!) 97.5 F (36.4 C) (Temporal)   Ht 5\' 3"  (1.6 m)   Wt 174 lb 9.6 oz (79.2 kg)   SpO2 95%   BMI 30.93 kg/m    Wt Readings from Last 3 Encounters:  02/03/23 174 lb 9.6 oz (79.2 kg)  11/12/22 173 lb 9.6 oz (78.7 kg)  08/05/22 171 lb (77.6 kg)    Physical Exam Vitals and nursing note reviewed.  Constitutional:      General: She is not in acute distress.    Appearance: Normal appearance. She is well-developed and well-groomed. She is obese. She is not ill-appearing, toxic-appearing or diaphoretic.  HENT:     Head: Normocephalic and atraumatic.     Jaw: There is normal jaw occlusion.     Right Ear: Hearing normal.     Left Ear: Hearing normal.     Nose: Nose normal.     Mouth/Throat:     Lips: Pink.     Mouth: Mucous membranes are moist.     Pharynx: Oropharynx is clear. Uvula midline.  Eyes:     General: Lids are normal.     Extraocular Movements: Extraocular movements intact.     Conjunctiva/sclera: Conjunctivae normal.     Pupils: Pupils are equal, round, and reactive to light.  Neck:     Thyroid: No thyroid mass, thyromegaly or thyroid tenderness.     Vascular: No carotid bruit or JVD.     Trachea: Trachea and phonation normal.  Cardiovascular:     Rate and Rhythm: Normal rate and regular rhythm.     Chest Wall: PMI is not displaced.     Pulses: Normal pulses.     Heart sounds: Normal heart sounds. No murmur heard.    No friction rub. No gallop.  Pulmonary:     Effort: Pulmonary effort is normal. No respiratory distress.     Breath sounds: Normal breath sounds. No wheezing.  Abdominal:     General: Bowel sounds are normal. There is no distension or abdominal bruit.     Palpations: Abdomen is soft. There is no hepatomegaly or splenomegaly.     Tenderness:  There is no abdominal tenderness. There is no right CVA tenderness or left CVA tenderness.     Hernia: No hernia is present.  Musculoskeletal:        General: Normal range of motion.     Cervical back: Normal range of motion and neck supple.     Right lower leg: No edema.     Left lower leg: No edema.  Lymphadenopathy:     Cervical: No cervical adenopathy.  Skin:    General: Skin is warm and dry.     Capillary Refill: Capillary refill takes less than 2 seconds.     Coloration: Skin is not cyanotic, jaundiced or pale.     Findings: No rash.  Neurological:     General: No focal deficit present.     Mental Status: She is alert and oriented to person, place, and time.     Sensory: Sensation is intact.     Motor: Motor function is intact.     Coordination: Coordination is intact.     Gait: Gait is intact.     Deep Tendon Reflexes: Reflexes are normal and symmetric.  Psychiatric:        Attention and Perception: Attention and perception normal.        Mood and Affect: Mood and affect normal.        Speech: Speech normal.        Behavior: Behavior normal. Behavior is cooperative.        Thought Content: Thought content normal.        Cognition and Memory: Cognition and memory normal.        Judgment: Judgment normal.    Physical Exam            Results for orders placed or performed in visit on 08/05/22  Bayer DCA Hb A1c Waived  Result Value Ref Range   HB A1C (BAYER DCA - WAIVED) 6.1 (H) 4.8 - 5.6 %  CBC with Differential/Platelet  Result Value Ref  Range   WBC 5.5 3.4 - 10.8 x10E3/uL   RBC 4.50 3.77 - 5.28 x10E6/uL   Hemoglobin 14.3 11.1 - 15.9 g/dL   Hematocrit 95.2 84.1 - 46.6 %   MCV 93 79 - 97 fL   MCH 31.8 26.6 - 33.0 pg   MCHC 34.0 31.5 - 35.7 g/dL   RDW 32.4 40.1 - 02.7 %   Platelets 286 150 - 450 x10E3/uL   Neutrophils 50 Not Estab. %   Lymphs 40 Not Estab. %   Monocytes 7 Not Estab. %   Eos 2 Not Estab. %   Basos 1 Not Estab. %   Neutrophils Absolute 2.8  1.4 - 7.0 x10E3/uL   Lymphocytes Absolute 2.2 0.7 - 3.1 x10E3/uL   Monocytes Absolute 0.4 0.1 - 0.9 x10E3/uL   EOS (ABSOLUTE) 0.1 0.0 - 0.4 x10E3/uL   Basophils Absolute 0.0 0.0 - 0.2 x10E3/uL   Immature Granulocytes 0 Not Estab. %   Immature Grans (Abs) 0.0 0.0 - 0.1 x10E3/uL  CMP14+EGFR  Result Value Ref Range   Glucose 97 70 - 99 mg/dL   BUN 19 8 - 27 mg/dL   Creatinine, Ser 2.53 (H) 0.57 - 1.00 mg/dL   eGFR 58 (L) >66 YQ/IHK/7.42   BUN/Creatinine Ratio 18 12 - 28   Sodium 141 134 - 144 mmol/L   Potassium 4.3 3.5 - 5.2 mmol/L   Chloride 104 96 - 106 mmol/L   CO2 22 20 - 29 mmol/L   Calcium 9.6 8.7 - 10.3 mg/dL   Total Protein 7.2 6.0 - 8.5 g/dL   Albumin 4.4 3.9 - 4.9 g/dL   Globulin, Total 2.8 1.5 - 4.5 g/dL   Albumin/Globulin Ratio 1.6 1.2 - 2.2   Bilirubin Total 0.6 0.0 - 1.2 mg/dL   Alkaline Phosphatase 115 44 - 121 IU/L   AST 24 0 - 40 IU/L   ALT 34 (H) 0 - 32 IU/L  Lipid panel  Result Value Ref Range   Cholesterol, Total 182 100 - 199 mg/dL   Triglycerides 595 0 - 149 mg/dL   HDL 62 >63 mg/dL   VLDL Cholesterol Cal 24 5 - 40 mg/dL   LDL Chol Calc (NIH) 96 0 - 99 mg/dL   Chol/HDL Ratio 2.9 0.0 - 4.4 ratio  Thyroid Panel With TSH  Result Value Ref Range   TSH 2.280 0.450 - 4.500 uIU/mL   T4, Total 8.4 4.5 - 12.0 ug/dL   T3 Uptake Ratio 23 (L) 24 - 39 %   Free Thyroxine Index 1.9 1.2 - 4.9  VITAMIN D 25 Hydroxy (Vit-D Deficiency, Fractures)  Result Value Ref Range   Vit D, 25-Hydroxy 47.8 30.0 - 100.0 ng/mL       Pertinent labs & imaging results that were available during my care of the patient were reviewed by me and considered in my medical decision making.  Assessment & Plan:  Rosene was seen today for prediabetes and arm pain.  Diagnoses and all orders for this visit:  Prediabetes -     CMP14+EGFR -     Bayer DCA Hb A1c Waived  Mixed hyperlipidemia -     atorvastatin (LIPITOR) 20 MG tablet; Take 1 tablet (20 mg total) by mouth daily.  Vitamin D  deficiency -     Vitamin D, Ergocalciferol, (DRISDOL) 1.25 MG (50000 UNIT) CAPS capsule; Take 1 capsule (50,000 Units total) by mouth every 7 (seven) days.  Leg cramps -     CMP14+EGFR  Osteopenia of multiple sites Due  for DEXA next year.   Encounter for screening mammogram for malignant neoplasm of breast -     MM 3D SCREENING MAMMOGRAM BILATERAL BREAST     Assessment and Plan    Left Arm Pain Intermittent sharp pain in the left arm, occurring biweekly, with one episode lasting 30 minutes. No falls or injuries reported. Differential includes musculoskeletal pain or possible neuropathy. - Monitor symptoms - Report if pain becomes more frequent or severe  Back Pain Chronic back pain following a fall. Occasional 'catches' in the back. No new falls or injuries reported. - Monitor symptoms  Leg Cramps Recent onset of right leg cramps, occurring twice. Exercise provided relief. Possible hypokalemia. Will check potassium levels in CMP. - Check potassium levels in CMP  Prediabetes A1c is 6.2%, consistent with prediabetes. No increased thirst, hunger, or urination reported. Slight weight gain noted, likely due to decreased activity. Discussed the importance of maintaining a healthy diet and regular exercise to manage prediabetes and prevent progression to diabetes. - Continue current diet - Follow up in six months for physical and labs  Hyperlipidemia On atorvastatin. No significant myalgia reported. Discussed the importance of adherence to atorvastatin to manage cholesterol levels and reduce cardiovascular risk. - Refill atorvastatin  Vitamin D Deficiency On weekly vitamin D supplementation. Vitamin D levels were normal in June. Refill needed. - Refill vitamin D  Vitamin B12 Deficiency Inconsistent with daily vitamin B12 supplementation. Discussed the importance of consistent intake to prevent deficiency symptoms. - Encourage consistent daily intake of vitamin B12  General  Health Maintenance Due for routine screenings and vaccinations. Mammogram order has been sent. DEXA scan to be repeated next year. Flu shot received at last visit. - Schedule mammogram - Repeat DEXA scan next year  Follow-up - Follow up in six months for physical and labs.          Continue all other maintenance medications.  Follow up plan: Return in about 6 months (around 08/04/2023), or if symptoms worsen or fail to improve, for CPE.   Continue healthy lifestyle choices, including diet (rich in fruits, vegetables, and lean proteins, and low in salt and simple carbohydrates) and exercise (at least 30 minutes of moderate physical activity daily).  Educational handout given for health maintenance  The above assessment and management plan was discussed with the patient. The patient verbalized understanding of and has agreed to the management plan. Patient is aware to call the clinic if they develop any new symptoms or if symptoms persist or worsen. Patient is aware when to return to the clinic for a follow-up visit. Patient educated on when it is appropriate to go to the emergency department.   Kari Baars, FNP-C [facility] [phone]

## 2023-02-04 ENCOUNTER — Other Ambulatory Visit: Payer: Self-pay | Admitting: *Deleted

## 2023-02-04 ENCOUNTER — Ambulatory Visit: Payer: 59 | Admitting: Family Medicine

## 2023-02-04 DIAGNOSIS — N289 Disorder of kidney and ureter, unspecified: Secondary | ICD-10-CM

## 2023-02-04 MED ORDER — ENALAPRIL MALEATE 2.5 MG PO TABS: 2.5000 mg | ORAL_TABLET | Freq: Every day | ORAL | 3 refills | Status: DC

## 2023-02-04 NOTE — Addendum Note (Signed)
Addended by: Sonny Masters on: 02/04/2023 08:46 AM   Modules accepted: Orders

## 2023-02-19 ENCOUNTER — Other Ambulatory Visit: Payer: 59

## 2023-02-19 DIAGNOSIS — N289 Disorder of kidney and ureter, unspecified: Secondary | ICD-10-CM

## 2023-02-20 LAB — BMP8+EGFR
BUN/Creatinine Ratio: 13 (ref 12–28)
BUN: 12 mg/dL (ref 8–27)
CO2: 22 mmol/L (ref 20–29)
Calcium: 9.6 mg/dL (ref 8.7–10.3)
Chloride: 106 mmol/L (ref 96–106)
Creatinine, Ser: 0.95 mg/dL (ref 0.57–1.00)
Glucose: 103 mg/dL — ABNORMAL HIGH (ref 70–99)
Potassium: 4.6 mmol/L (ref 3.5–5.2)
Sodium: 141 mmol/L (ref 134–144)
eGFR: 67 mL/min/{1.73_m2} (ref >59)

## 2023-02-27 ENCOUNTER — Encounter: Payer: Self-pay | Admitting: Family

## 2023-02-27 ENCOUNTER — Telehealth (INDEPENDENT_AMBULATORY_CARE_PROVIDER_SITE_OTHER): Payer: 59 | Admitting: Family

## 2023-02-27 ENCOUNTER — Other Ambulatory Visit: Payer: Self-pay | Admitting: *Deleted

## 2023-02-27 ENCOUNTER — Other Ambulatory Visit: Payer: 59

## 2023-02-27 DIAGNOSIS — R300 Dysuria: Secondary | ICD-10-CM

## 2023-02-27 DIAGNOSIS — R399 Unspecified symptoms and signs involving the genitourinary system: Secondary | ICD-10-CM

## 2023-02-27 LAB — URINALYSIS, COMPLETE
Bilirubin, UA: NEGATIVE
Glucose, UA: NEGATIVE
Ketones, UA: NEGATIVE
Nitrite, UA: POSITIVE — AB
Specific Gravity, UA: 1.025 (ref 1.005–1.030)
Urobilinogen, Ur: 0.2 mg/dL (ref 0.2–1.0)
pH, UA: 5.5 (ref 5.0–7.5)

## 2023-02-27 LAB — MICROSCOPIC EXAMINATION
Renal Epithel, UA: NONE SEEN /[HPF]
WBC, UA: >30 /[HPF] — AB (ref 0–5)
Yeast, UA: NONE SEEN

## 2023-02-27 MED ORDER — CEPHALEXIN 500 MG PO CAPS: 500.0000 mg | ORAL_CAPSULE | Freq: Two times a day (BID) | ORAL | 0 refills | Status: DC

## 2023-02-27 NOTE — Progress Notes (Signed)
Virtual Visit Consent   Alexis Sandoval, you are scheduled for a virtual visit with a [facility] provider today. Just as with appointments in the office, your consent must be obtained to participate. Your consent will be active for this visit and any virtual visit you may have with one of our providers in the next 365 days. If you have a MyChart account, a copy of this consent can be sent to you electronically.  As this is a virtual visit, video technology does not allow for your provider to perform a traditional examination. This may limit your provider's ability to fully assess your condition. If your provider identifies any concerns that need to be evaluated in person or the need to arrange testing (such as labs, EKG, etc.), we will make arrangements to do so. Although advances in technology are sophisticated, we cannot ensure that it will always work on either your end or our end. If the connection with a video visit is poor, the visit may have to be switched to a telephone visit. With either a video or telephone visit, we are not always able to ensure that we have a secure connection.  By engaging in this virtual visit, you consent to the provision of healthcare and authorize for your insurance to be billed (if applicable) for the services provided during this visit. Depending on your insurance coverage, you may receive a charge related to this service.  I need to obtain your verbal consent now. Are you willing to proceed with your visit today? [name] has provided verbal consent on 02/27/2023 for a virtual visit (video or telephone). [name], FNP  Date: 02/27/2023 12:12 PM  Virtual Visit via Video Note   I, [name], connected with  Alexis Sandoval  ([id number], 02-06-1961) on 02/27/23 at 12:10 PM EST by a video-enabled telemedicine application and verified that I am speaking with the correct person using two identifiers.  Location: Patient: Virtual Visit Location  Patient: Home Provider: Virtual Visit Location Provider: Home Office   I discussed the limitations of evaluation and management by telemedicine and the availability of in person appointments. The patient expressed understanding and agreed to proceed.    History of Present Illness: Alexis Sandoval is a 62 y.o. who identifies as a female who was assigned female at birth, and is being seen today for urinary frequency and urgency that started yesterday.  HPI: Urinary Frequency  This is a new problem. The current episode started yesterday. The problem occurs intermittently. The problem has been unchanged. The quality of the pain is described as burning. The pain is mild. Associated symptoms include frequency, hesitancy and urgency. Pertinent negatives include no hematuria or nausea. She has tried increased fluids for the symptoms. The treatment provided mild relief.    Problems:  Patient Active Problem List   Diagnosis Date Noted   Vitamin D deficiency 01/30/2021   Osteopenia of multiple sites 01/30/2021   Prediabetes 10/23/2020   Mixed hyperlipidemia 10/23/2020   BMI 29.0-29.9,adult 10/23/2020   Postmenopausal 10/23/2020    Allergies: No Known Allergies Medications:  Current Outpatient Medications:    cephALEXin (KEFLEX) 500 MG capsule, Take 1 capsule (500 mg total) by mouth 2 (two) times daily., Disp: 14 capsule, Rfl: 0   atorvastatin (LIPITOR) 20 MG tablet, Take 1 tablet (20 mg total) by mouth daily., Disp: 90 tablet, Rfl: 2   Cyanocobalamin (VITAMIN B 12 PO), Take by mouth., Disp: , Rfl:    diclofenac Sodium (VOLTAREN) 1 %  GEL, Apply 2 g topically 4 (four) times daily., Disp: 350 g, Rfl: 1   enalapril (VASOTEC) 2.5 MG tablet, Take 1 tablet (2.5 mg total) by mouth daily., Disp: 90 tablet, Rfl: 3   Vitamin D, Ergocalciferol, (DRISDOL) 1.25 MG (50000 UNIT) CAPS capsule, Take 1 capsule (50,000 Units total) by mouth every 7 (seven) days., Disp: 5 capsule, Rfl:  1  Observations/Objective: Patient is well-developed, well-nourished in no acute distress.  Resting comfortably  at home.  Head is normocephalic, atraumatic.  No labored breathing.  Speech is clear and coherent with logical content.  Patient is alert and oriented at baseline.    Assessment and Plan: 1. UTI symptoms (Primary) - cephALEXin (KEFLEX) 500 MG capsule; Take 1 capsule (500 mg total) by mouth 2 (two) times daily.  Dispense: 14 capsule; Refill: 0  Force fluids AZO over the counter X2 days Follow up if symptoms worsen or do not improve   Follow Up Instructions: I discussed the assessment and treatment plan with the patient. The patient was provided an opportunity to ask questions and all were answered. The patient agreed with the plan and demonstrated an understanding of the instructions.  A copy of instructions were sent to the patient via MyChart unless otherwise noted below.     The patient was advised to call back or seek an in-person evaluation if the symptoms worsen or if the condition fails to improve as anticipated.    [name], FNP

## 2023-03-02 LAB — URINE CULTURE

## 2023-04-16 ENCOUNTER — Other Ambulatory Visit: Payer: Self-pay | Admitting: Family Medicine

## 2023-04-16 DIAGNOSIS — E559 Vitamin D deficiency, unspecified: Secondary | ICD-10-CM

## 2023-05-21 ENCOUNTER — Other Ambulatory Visit: Payer: Self-pay | Admitting: Family Medicine

## 2023-05-21 DIAGNOSIS — E559 Vitamin D deficiency, unspecified: Secondary | ICD-10-CM

## 2023-06-02 DEATH — deceased

## 2023-06-22 ENCOUNTER — Other Ambulatory Visit: Payer: Self-pay | Admitting: Family Medicine

## 2023-06-22 DIAGNOSIS — E559 Vitamin D deficiency, unspecified: Secondary | ICD-10-CM

## 2023-07-28 ENCOUNTER — Other Ambulatory Visit: Payer: Self-pay | Admitting: Family Medicine

## 2023-07-28 DIAGNOSIS — E559 Vitamin D deficiency, unspecified: Secondary | ICD-10-CM

## 2023-07-28 NOTE — Telephone Encounter (Signed)
 Needs appt for check up/ vitamin D  testing

## 2023-07-28 NOTE — Telephone Encounter (Signed)
 Last OV 02/03/2023. Last RF 06/22/23 #5 0RF. Next OV 08/05/2023

## 2023-07-29 NOTE — Telephone Encounter (Signed)
 Informed pt that it was time to have her Vit D level rechecked, she has an appt next week and to have that requested.

## 2023-08-05 ENCOUNTER — Ambulatory Visit: Payer: Self-pay | Admitting: Family Medicine

## 2023-08-05 ENCOUNTER — Encounter: Payer: Self-pay | Admitting: Family Medicine

## 2023-08-05 ENCOUNTER — Ambulatory Visit (INDEPENDENT_AMBULATORY_CARE_PROVIDER_SITE_OTHER): Payer: 59 | Admitting: Family Medicine

## 2023-08-05 VITALS — BP 139/84 | HR 70 | Temp 97.8°F | Ht 63.0 in | Wt 171.0 lb

## 2023-08-05 DIAGNOSIS — E559 Vitamin D deficiency, unspecified: Secondary | ICD-10-CM | POA: Diagnosis not present

## 2023-08-05 DIAGNOSIS — Z0000 Encounter for general adult medical examination without abnormal findings: Secondary | ICD-10-CM

## 2023-08-05 DIAGNOSIS — N289 Disorder of kidney and ureter, unspecified: Secondary | ICD-10-CM | POA: Diagnosis not present

## 2023-08-05 DIAGNOSIS — Z0001 Encounter for general adult medical examination with abnormal findings: Secondary | ICD-10-CM | POA: Diagnosis not present

## 2023-08-05 DIAGNOSIS — Z1231 Encounter for screening mammogram for malignant neoplasm of breast: Secondary | ICD-10-CM

## 2023-08-05 DIAGNOSIS — H1013 Acute atopic conjunctivitis, bilateral: Secondary | ICD-10-CM | POA: Diagnosis not present

## 2023-08-05 DIAGNOSIS — L2084 Intrinsic (allergic) eczema: Secondary | ICD-10-CM

## 2023-08-05 DIAGNOSIS — E782 Mixed hyperlipidemia: Secondary | ICD-10-CM

## 2023-08-05 DIAGNOSIS — R7303 Prediabetes: Secondary | ICD-10-CM

## 2023-08-05 LAB — BAYER DCA HB A1C WAIVED: HB A1C (BAYER DCA - WAIVED): 6 % — ABNORMAL HIGH (ref 4.8–5.6)

## 2023-08-05 MED ORDER — ATORVASTATIN CALCIUM 20 MG PO TABS: 20.0000 mg | ORAL_TABLET | Freq: Every day | ORAL | 1 refills | Status: DC

## 2023-08-05 MED ORDER — TRIAMCINOLONE ACETONIDE 0.1 % EX CREA: 1.0000 | TOPICAL_CREAM | Freq: Two times a day (BID) | CUTANEOUS | 0 refills | Status: DC

## 2023-08-05 MED ORDER — VITAMIN D (ERGOCALCIFEROL) 1.25 MG (50000 UNIT) PO CAPS: 50000.0000 [IU] | ORAL_CAPSULE | ORAL | 1 refills | Status: DC

## 2023-08-05 MED ORDER — OLOPATADINE HCL 0.2 % OP SOLN
1.0000 [drp] | Freq: Every day | OPHTHALMIC | 11 refills | Status: AC
Start: 2023-08-05 — End: ?

## 2023-08-05 MED ORDER — ENALAPRIL MALEATE 2.5 MG PO TABS: 2.5000 mg | ORAL_TABLET | Freq: Every day | ORAL | 1 refills | Status: DC

## 2023-08-05 NOTE — Progress Notes (Signed)
 Complete physical exam  Patient: Alexis Sandoval   DOB: [birth year]   63 y.o. Female  MRN: [id number]  Subjective:     Chief Complaint  Patient presents with   Annual Exam   Eye Pain    Bilateral eye burning x 3 months    Eczema    Behind bilateral ears     Alexis Sandoval is a 63 y.o. female who presents today for a complete physical exam. She reports consuming a general diet. The patient does not participate in regular exercise at present. She generally feels well. She reports sleeping well. She does have additional problems to discuss today.   Alexis Sandoval is a 63 year old female who presents with eczema and eye irritation.  She has been experiencing eczema primarily behind her ears and on her scalp, which has worsened this year. The eczema is described as 'bubbled up' and red, particularly after scratching. She has not used any prescription topical treatments but has tried washing her hair with apple cider vinegar, which provided some relief. The eczema is particularly itchy at night. No lumps, bumps, or other skin changes are noted.  She reports an eye infection persisting for over three months, characterized by crustiness in the mornings, scratchiness, and burning in the evenings. The eyes water frequently, and she notes a yellow tint to the drainage when her eyes are matted. She has tried using a hot towel for relief. She recently visited an eye doctor who mentioned the beginnings of glaucoma and possible cataracts, but the current symptoms were not present at that time. No changes in vision are reported.  Her current medications include vitamin D , which she takes once a week, and atorvastatin , which she reports no issues with. She was previously on enalapril  but stopped after two weeks as advised. Her A1c is currently 6.0, indicating prediabetes. She has had pneumonia in the past and received a flu shot but not a pneumonia shot. She reports normal bowel movements and  occasional sleep disturbances, possibly due to anxiety about the visit.       Most recent fall risk assessment:    08/05/2023    8:04 AM  Fall Risk   Falls in the past year? 1  Number falls in past yr: 0  Injury with Fall? 1  Risk for fall due to : History of fall(s)  Follow up Falls evaluation completed     Most recent depression screenings:    08/05/2023    8:04 AM 02/03/2023    8:56 AM  PHQ 2/9 Scores  PHQ - 2 Score 0 0  PHQ- 9 Score 0 0    Vision:Within last year and Dental: No current dental problems  Patient Active Problem List   Diagnosis Date Noted   Vitamin D  deficiency 01/30/2021   Osteopenia of multiple sites 01/30/2021   Prediabetes 10/23/2020   Mixed hyperlipidemia 10/23/2020   BMI 29.0-29.9,adult 10/23/2020   Postmenopausal 10/23/2020   Past Medical History:  Diagnosis Date   GERD (gastroesophageal reflux disease)    does not take anything for it   Past Surgical History:  Procedure Laterality Date   BIOPSY  04/26/2021   Procedure: BIOPSY;  Surgeon: Vinetta Greening, DO;  Location: [facility];  Service: Endoscopy;;   COLONOSCOPY WITH PROPOFOL  N/A 04/26/2021   Procedure: COLONOSCOPY WITH PROPOFOL ;  Surgeon: Vinetta Greening, DO;  Location: [facility];  Service: Endoscopy;  Laterality: N/A;  1:15 / ASA 2   ORIF WRIST  FRACTURE Right 11/04/2014   Procedure: OPEN REDUCTION INTERNAL FIXATION (ORIF) WRIST FRACTURE;  Surgeon: Ronn Cohn, MD;  Location: [facility];  Service: Orthopedics;  Laterality: Right;   PATELLA FRACTURE SURGERY     TUBAL LIGATION     Social History   Tobacco Use   Smoking status: Never   Smokeless tobacco: Never  Vaping Use   Vaping status: Never Used  Substance Use Topics   Alcohol use: No   Drug use: No   Social History   Socioeconomic History   Marital status: Married    Spouse name: Gita Lamb   Number of children: 2   Years of education: Not on file   Highest education level: Associate degree: occupational, Scientist, product/process development,  or vocational program  Occupational History   Not on file  Tobacco Use   Smoking status: Never   Smokeless tobacco: Never  Vaping Use   Vaping status: Never Used  Substance and Sexual Activity   Alcohol use: No   Drug use: No   Sexual activity: Not on file  Other Topics Concern   Not on file  Social History Narrative   Not on file   Social Drivers of Health   Financial Resource Strain: Patient Declined (11/12/2022)   Overall Financial Resource Strain (CARDIA)    Difficulty of Paying Living Expenses: Patient declined  Food Insecurity: No Food Insecurity (11/12/2022)   Hunger Vital Sign    Worried About Running Out of Food in the Last Year: Never true    Ran Out of Food in the Last Year: Never true  Transportation Needs: No Transportation Needs (11/12/2022)   PRAPARE - Administrator, Civil Service (Medical): No    Lack of Transportation (Non-Medical): No  Physical Activity: Insufficiently Active (11/12/2022)   Exercise Vital Sign    Days of Exercise per Week: 7 days    Minutes of Exercise per Session: 10 min  Stress: No Stress Concern Present (11/12/2022)   [employer] of Occupational Health - Occupational Stress Questionnaire    Feeling of Stress : Only a little  Social Connections: Socially Integrated (11/12/2022)   Social Connection and Isolation Panel [NHANES]    Frequency of Communication with Friends and Family: More than three times a week    Frequency of Social Gatherings with Friends and Family: Once a week    Attends Religious Services: More than 4 times per year    Active Member of [employer] or Organizations: Yes    Attends Engineer, structural: More than 4 times per year    Marital Status: Married  Catering manager Violence: Not At Risk (12/03/2021)   Received from [facility], [facility]   Humiliation, Afraid, Rape, and Kick questionnaire    Fear of Current or Ex-Partner: No    Emotionally Abused: No    Physically Abused: No     Sexually Abused: No   Family Status  Relation Name Status   Mother  Alive   Father  Deceased   Sister  Alive   Sister  Alive   Sister  Alive   Daughter  Alive   Daughter  Alive  No partnership data on file   Family History  Problem Relation Age of Onset   Hypertension Mother    Hyperlipidemia Mother    Diabetes Mother    Heart attack Father    No Known Allergies    Patient Care Team: Nastassja Witkop, Georgeann Kindred, FNP as PCP - General (Family Medicine)   Outpatient  Medications Prior to Visit  Medication Sig   Cyanocobalamin (VITAMIN B 12 PO) Take by mouth.   [DISCONTINUED] atorvastatin  (LIPITOR) 20 MG tablet Take 1 tablet (20 mg total) by mouth daily.   [DISCONTINUED] cephALEXin  (KEFLEX ) 500 MG capsule Take 1 capsule (500 mg total) by mouth 2 (two) times daily.   [DISCONTINUED] diclofenac  Sodium (VOLTAREN ) 1 % GEL Apply 2 g topically 4 (four) times daily.   [DISCONTINUED] enalapril  (VASOTEC ) 2.5 MG tablet Take 1 tablet (2.5 mg total) by mouth daily.   [DISCONTINUED] Vitamin D , Ergocalciferol , (DRISDOL ) 1.25 MG (50000 UNIT) CAPS capsule TAKE 1 CAPSULE (50,000 UNITS TOTAL) BY MOUTH EVERY 7 (SEVEN) DAYS   No facility-administered medications prior to visit.    ROS per HPI     Objective:     BP 139/84   Pulse 70   Temp 97.8 F (36.6 C)   Ht 5\' 3"  (1.6 m)   Wt 171 lb (77.6 kg)   SpO2 99%   BMI 30.29 kg/m  BP Readings from Last 3 Encounters:  08/05/23 139/84  02/03/23 139/75  11/12/22 127/73   Wt Readings from Last 3 Encounters:  08/05/23 171 lb (77.6 kg)  02/03/23 174 lb 9.6 oz (79.2 kg)  11/12/22 173 lb 9.6 oz (78.7 kg)   SpO2 Readings from Last 3 Encounters:  08/05/23 99%  02/03/23 95%  11/12/22 96%      Physical Exam Vitals and nursing note reviewed.  Constitutional:      General: She is not in acute distress.    Appearance: Normal appearance. She is well-developed and well-groomed. She is obese. She is not ill-appearing, toxic-appearing or diaphoretic.   HENT:     Head: Normocephalic and atraumatic.     Jaw: There is normal jaw occlusion.     Right Ear: Hearing, tympanic membrane, ear canal and external ear normal.     Left Ear: Hearing, tympanic membrane, ear canal and external ear normal.     Nose: Nose normal.     Mouth/Throat:     Lips: Pink.     Mouth: Mucous membranes are moist.     Pharynx: Oropharynx is clear. Uvula midline.  Eyes:     General: Lids are normal. Allergic shiner present.     Extraocular Movements: Extraocular movements intact.     Conjunctiva/sclera: Conjunctivae normal.     Pupils: Pupils are equal, round, and reactive to light.     Comments: Eyes watering, minimal redness to conjunctivae. No purulent drainage.  Neck:     Thyroid : No thyroid  mass, thyromegaly or thyroid  tenderness.     Vascular: No carotid bruit or JVD.     Trachea: Trachea and phonation normal.  Cardiovascular:     Rate and Rhythm: Normal rate and regular rhythm.     Chest Wall: PMI is not displaced.     Pulses: Normal pulses.     Heart sounds: Normal heart sounds. No murmur heard.    No friction rub. No gallop.  Pulmonary:     Effort: Pulmonary effort is normal. No respiratory distress.     Breath sounds: Normal breath sounds. No wheezing.  Abdominal:     General: Bowel sounds are normal. There is no distension or abdominal bruit.     Palpations: Abdomen is soft. There is no hepatomegaly or splenomegaly.     Tenderness: There is no abdominal tenderness. There is no right CVA tenderness or left CVA tenderness.     Hernia: No hernia is present.  Musculoskeletal:  General: Normal range of motion.     Cervical back: Normal range of motion and neck supple.     Right lower leg: No edema.     Left lower leg: No edema.  Lymphadenopathy:     Cervical: No cervical adenopathy.  Skin:    General: Skin is warm and dry.     Capillary Refill: Capillary refill takes less than 2 seconds.     Coloration: Skin is not cyanotic, jaundiced or  pale.     Findings: Erythema and rash present. Rash is macular and papular.     Comments: Rash behind both ears  Neurological:     General: No focal deficit present.     Mental Status: She is alert and oriented to person, place, and time.     Sensory: Sensation is intact.     Motor: Motor function is intact.     Coordination: Coordination is intact.     Gait: Gait is intact.     Deep Tendon Reflexes: Reflexes are normal and symmetric.  Psychiatric:        Attention and Perception: Attention and perception normal.        Mood and Affect: Mood and affect normal.        Speech: Speech normal.        Behavior: Behavior normal. Behavior is cooperative.        Thought Content: Thought content normal.        Cognition and Memory: Cognition and memory normal.        Judgment: Judgment normal.      Results for orders placed or performed in visit on 08/05/23  Bayer DCA Hb A1c Waived  Result Value Ref Range   HB A1C (BAYER DCA - WAIVED) 6.0 (H) 4.8 - 5.6 %   Last CBC Lab Results  Component Value Date   WBC 5.5 08/05/2022   HGB 14.3 08/05/2022   HCT 42.0 08/05/2022   MCV 93 08/05/2022   MCH 31.8 08/05/2022   RDW 12.0 08/05/2022   PLT 286 08/05/2022   Last metabolic panel Lab Results  Component Value Date   GLUCOSE 103 (H) 02/19/2023   NA 141 02/19/2023   K 4.6 02/19/2023   CL 106 02/19/2023   CO2 22 02/19/2023   BUN 12 02/19/2023   CREATININE 0.95 02/19/2023   EGFR 67 02/19/2023   CALCIUM  9.6 02/19/2023   PROT 7.0 02/03/2023   ALBUMIN 4.3 02/03/2023   LABGLOB 2.7 02/03/2023   AGRATIO 1.6 08/05/2022   BILITOT 0.6 02/03/2023   ALKPHOS 99 02/03/2023   AST 20 02/03/2023   ALT 27 02/03/2023   ANIONGAP 9 11/03/2014   Last lipids Lab Results  Component Value Date   CHOL 182 08/05/2022   HDL 62 08/05/2022   LDLCALC 96 08/05/2022   TRIG 135 08/05/2022   CHOLHDL 2.9 08/05/2022   Last hemoglobin A1c Lab Results  Component Value Date   HGBA1C 6.0 (H) 08/05/2023    Last thyroid  functions Lab Results  Component Value Date   TSH 2.280 08/05/2022   T4TOTAL 8.4 08/05/2022   Last vitamin D  Lab Results  Component Value Date   VD25OH 47.8 08/05/2022      Assessment & Plan:    Routine Health Maintenance and Physical Exam  Immunization History  Administered Date(s) Administered   Influenza Inj Mdck Quad Pf 01/07/2021   Influenza Split 03/18/2017   Influenza, Seasonal, Injecte, Preservative Fre 11/12/2022   Influenza,inj,Quad PF,6+ Mos 02/14/2022   Influenza,trivalent, recombinat, inj,  PF 03/18/2017   Moderna Sars-Covid-2 Vaccination 03/12/2019, 04/09/2019   Tdap 03/03/2012, 11/03/2014   Zoster Recombinant(Shingrix ) 08/13/2021, 01/31/2022    Health Maintenance  Topic Date Due   MAMMOGRAM  12/14/2022   COVID-19 Vaccine (3 - 2024-25 season) 08/21/2023 (Originally 11/02/2022)   INFLUENZA VACCINE  10/02/2023   DTaP/Tdap/Td (3 - Td or Tdap) 11/02/2024   Cervical Cancer Screening (HPV/Pap Cotest)  12/04/2026   Colonoscopy  04/27/2031   Hepatitis C Screening  Completed   HIV Screening  Completed   Zoster Vaccines- Shingrix   Completed   HPV VACCINES  Aged Out   Meningococcal B Vaccine  Aged Out    Discussed health benefits of physical activity, and encouraged her to engage in regular exercise appropriate for her age and condition.  Problem List Items Addressed This Visit       Other   Prediabetes - Primary   Relevant Orders   CBC with Differential/Platelet   CMP14+EGFR   Bayer DCA Hb A1c Waived (Completed)   Mixed hyperlipidemia   Relevant Medications   atorvastatin  (LIPITOR) 20 MG tablet   enalapril  (VASOTEC ) 2.5 MG tablet   Other Relevant Orders   Lipid panel   Vitamin D  deficiency   Relevant Medications   Vitamin D , Ergocalciferol , (DRISDOL ) 1.25 MG (50000 UNIT) CAPS capsule   Other Relevant Orders   Vitamin D , 25-hydroxy   Other Visit Diagnoses       Renal function impairment       Relevant Medications   enalapril   (VASOTEC ) 2.5 MG tablet     Annual physical exam       Relevant Orders   CBC with Differential/Platelet   CMP14+EGFR   Thyroid  Panel With TSH   MM Digital Screening     Intrinsic eczema       Relevant Medications   triamcinolone cream (KENALOG) 0.1 %     Encounter for screening mammogram for malignant neoplasm of breast       Relevant Orders   MM Digital Screening     Allergic conjunctivitis of both eyes       Relevant Medications   Olopatadine HCl 0.2 % SOLN         Eczema Eczema primarily affecting the scalp and behind the ears, with recent exacerbation this year. Symptoms include redness, itching, and bubbling of the skin. She has not used prescription creams previously. The use of ketoconazole shampoo or Head and Shoulders is recommended due to her antifungal properties, which can help with fungal dermatitis often associated with eczema on the scalp. - Recommend ketoconazole shampoo or Head and Shoulders twice a week for scalp treatment. - Prescribe triamcinolone cream to be applied to the rash twice daily until resolution, then continue for two additional days. - Advise to keep the cream for future use if needed.  Allergic Conjunctivitis Chronic eye irritation for over three months, characterized by crusting in the morning, scratchiness, burning, and watery discharge. Symptoms suggest allergic conjunctivitis rather than bacterial infection. Pataday eye drops are prescribed to alleviate symptoms. - Prescribe Pataday eye drops, one drop in each eye every morning. - Instruct to report if no significant improvement within a week or if symptoms worsen.  Prediabetes A1c is 6.0, indicating prediabetes. She was previously on enalapril  for renal protection but discontinued after two weeks. Resuming enalapril  is recommended to protect renal function and manage blood pressure, as it helps prevent kidney damage from high blood sugars and maintains blood pressure within a normal range. -  Resume enalapril  2.5  mg daily for renal protection and blood pressure management. - Monitor renal function. - Provide dietary and exercise guidance to manage prediabetes.  Hyperlipidemia On atorvastatin  for cholesterol management with no reported side effects. - Continue atorvastatin  as prescribed.  Vitamin D  Deficiency Requires ongoing vitamin D  supplementation. - Refill vitamin D  prescription.  General Health Maintenance Due for a mammogram and will require a pneumonia vaccination at age 31. Mammogram order will be sent to her preferred center. - Order mammogram and instruct her to schedule the appointment. - Plan for pneumonia vaccination at the next physical when she turns 63.  Follow-up Follow-up plans to monitor A1c and renal function. - Schedule follow-up in six months to monitor A1c and renal function.       Return in 6 months (on 02/04/2024), or if symptoms worsen or fail to improve, for prediabetes .     Kattie Parrot, FNP

## 2023-08-05 NOTE — Patient Instructions (Signed)
 Understanding your Hemoglobin A1c: 6.0     Diabetes Mellitus and Nutrition    I think that you would greatly benefit from seeing a nutritionist. If this is something you are interested in, please call Dr Florinda Hush at [phone] to schedule an appointment.   When you have diabetes (diabetes mellitus), it is very important to have healthy eating habits because your blood sugar (glucose) levels are greatly affected by what you eat and drink. Eating healthy foods in the appropriate amounts, at about the same times every day, can help you: Control your blood glucose. Lower your risk of heart disease. Improve your blood pressure. Reach or maintain a healthy weight.  Every person with diabetes is different, and each person has different needs for a meal plan. Your health care provider may recommend that you work with a diet and nutrition specialist (dietitian) to make a meal plan that is best for you. Your meal plan may vary depending on factors such as: The calories you need. The medicines you take. Your weight. Your blood glucose, blood pressure, and cholesterol levels. Your activity level. Other health conditions you have, such as heart or kidney disease.  How do carbohydrates affect me? Carbohydrates affect your blood glucose level more than any other type of food. Eating carbohydrates naturally increases the amount of glucose in your blood. Carbohydrate counting is a method for keeping track of how many carbohydrates you eat. Counting carbohydrates is important to keep your blood glucose at a healthy level, especially if you use insulin or take certain oral diabetes medicines. It is important to know how many carbohydrates you can safely have in each meal. This is different for every person. Your dietitian can help you calculate how many carbohydrates you should have at each meal and for snack. Foods that contain carbohydrates include: Bread, cereal, rice, pasta, and  crackers. Potatoes and corn. Peas, beans, and lentils. Milk and yogurt. Fruit and juice. Desserts, such as cakes, cookies, ice cream, and candy.  How does alcohol affect me? Alcohol can cause a sudden decrease in blood glucose (hypoglycemia), especially if you use insulin or take certain oral diabetes medicines. Hypoglycemia can be a life-threatening condition. Symptoms of hypoglycemia (sleepiness, dizziness, and confusion) are similar to symptoms of having too much alcohol. If your health care provider says that alcohol is safe for you, follow these guidelines: Limit alcohol intake to no more than 1 drink per day for nonpregnant women and 2 drinks per day for men. One drink equals 12 oz of beer, 5 oz of wine, or 1 oz of hard liquor. Do not drink on an empty stomach. Keep yourself hydrated with water, diet soda, or unsweetened iced tea. Keep in mind that regular soda, juice, and other mixers may contain a lot of sugar and must be counted as carbohydrates.  What are tips for following this plan?  Reading food labels Start by checking the serving size on the label. The amount of calories, carbohydrates, fats, and other nutrients listed on the label are based on one serving of the food. Many foods contain more than one serving per package. Check the total grams (g) of carbohydrates in one serving. You can calculate the number of servings of carbohydrates in one serving by dividing the total carbohydrates by 15. For example, if a food has 30 g of total carbohydrates, it would be equal to 2 servings of carbohydrates. Check the number of grams (g) of saturated and trans fats in one serving. Choose foods  that have low or no amount of these fats. Check the number of milligrams (mg) of sodium in one serving. Most people should limit total sodium intake to less than 2,300 mg per day. Always check the nutrition information of foods labeled as "low-fat" or "nonfat". These foods may be higher in added  sugar or refined carbohydrates and should be avoided. Talk to your dietitian to identify your daily goals for nutrients listed on the label.  Shopping Avoid buying canned, premade, or processed foods. These foods tend to be high in fat, sodium, and added sugar. Shop around the outside edge of the grocery store. This includes fresh fruits and vegetables, bulk grains, fresh meats, and fresh dairy.  Cooking Use low-heat cooking methods, such as baking, instead of high-heat cooking methods like deep frying. Cook using healthy oils, such as olive, canola, or sunflower oil. Avoid cooking with butter, cream, or high-fat meats.  Meal planning Eat meals and snacks regularly, preferably at the same times every day. Avoid going long periods of time without eating. Eat foods high in fiber, such as fresh fruits, vegetables, beans, and whole grains. Talk to your dietitian about how many servings of carbohydrates you can eat at each meal. Eat 4-6 ounces of lean protein each day, such as lean meat, chicken, fish, eggs, or tofu. 1 ounce is equal to 1 ounce of meat, chicken, or fish, 1 egg, or 1/4 cup of tofu. Eat some foods each day that contain healthy fats, such as avocado, nuts, seeds, and fish.  Lifestyle  Check your blood glucose regularly. Exercise at least 30 minutes 5 or more days each week, or as told by your health care provider. Take medicines as told by your health care provider. Do not use any products that contain nicotine or tobacco, such as cigarettes and e-cigarettes. If you need help quitting, ask your health care provider. Work with a Veterinary surgeon or diabetes educator to identify strategies to manage stress and any emotional and social challenges.  What are some questions to ask my health care provider? Do I need to meet with a diabetes educator? Do I need to meet with a dietitian? What number can I call if I have questions? When are the best times to check my blood glucose?  Where to  find more information: American Diabetes Association: diabetes.org/food-and-fitness/food Academy of Nutrition and Dietetics: https://www.vargas.com/ [employer] of Diabetes and Digestive and Kidney Diseases (NIH): FindJewelers.cz  Summary A healthy meal plan will help you control your blood glucose and maintain a healthy lifestyle. Working with a diet and nutrition specialist (dietitian) can help you make a meal plan that is best for you. Keep in mind that carbohydrates and alcohol have immediate effects on your blood glucose levels. It is important to count carbohydrates and to use alcohol carefully. This information is not intended to replace advice given to you by your health care provider. Make sure you discuss any questions you have with your health care provider. Document Released: 11/14/2004 Document Revised: 03/24/2016 Document Reviewed: 03/24/2016 Elsevier Interactive Patient Education  [employer].

## 2023-08-06 LAB — THYROID PANEL WITH TSH
Free Thyroxine Index: 2.2 (ref 1.2–4.9)
T3 Uptake Ratio: 25 % (ref 24–39)
T4, Total: 8.6 ug/dL (ref 4.5–12.0)
TSH: 2.2 u[IU]/mL (ref 0.450–4.500)

## 2023-08-06 LAB — VITAMIN D 25 HYDROXY (VIT D DEFICIENCY, FRACTURES): Vit D, 25-Hydroxy: 48.9 ng/mL (ref 30.0–100.0)

## 2023-08-06 LAB — LIPID PANEL
Chol/HDL Ratio: 3.1 ratio (ref 0.0–4.4)
Cholesterol, Total: 168 mg/dL (ref 100–199)
HDL: 55 mg/dL (ref >39)
LDL Chol Calc (NIH): 90 mg/dL (ref 0–99)
Triglycerides: 131 mg/dL (ref 0–149)
VLDL Cholesterol Cal: 23 mg/dL (ref 5–40)

## 2023-08-06 LAB — CMP14+EGFR
ALT: 34 IU/L — ABNORMAL HIGH (ref 0–32)
AST: 24 IU/L (ref 0–40)
Albumin: 4.3 g/dL (ref 3.9–4.9)
Alkaline Phosphatase: 106 IU/L (ref 44–121)
BUN/Creatinine Ratio: 17 (ref 12–28)
BUN: 18 mg/dL (ref 8–27)
Bilirubin Total: 0.6 mg/dL (ref 0.0–1.2)
CO2: 21 mmol/L (ref 20–29)
Calcium: 9.3 mg/dL (ref 8.7–10.3)
Chloride: 104 mmol/L (ref 96–106)
Creatinine, Ser: 1.03 mg/dL — ABNORMAL HIGH (ref 0.57–1.00)
Globulin, Total: 2.9 g/dL (ref 1.5–4.5)
Glucose: 106 mg/dL — ABNORMAL HIGH (ref 70–99)
Potassium: 4.3 mmol/L (ref 3.5–5.2)
Sodium: 140 mmol/L (ref 134–144)
Total Protein: 7.2 g/dL (ref 6.0–8.5)
eGFR: 61 mL/min/{1.73_m2} (ref >59)

## 2023-08-06 LAB — CBC WITH DIFFERENTIAL/PLATELET
Basophils Absolute: 0.1 10*3/uL (ref 0.0–0.2)
Basos: 1 %
EOS (ABSOLUTE): 0.2 10*3/uL (ref 0.0–0.4)
Eos: 3 %
Hematocrit: 39.8 % (ref 34.0–46.6)
Hemoglobin: 13.8 g/dL (ref 11.1–15.9)
Immature Grans (Abs): 0 10*3/uL (ref 0.0–0.1)
Immature Granulocytes: 0 %
Lymphocytes Absolute: 2.6 10*3/uL (ref 0.7–3.1)
Lymphs: 48 %
MCH: 32 pg (ref 26.6–33.0)
MCHC: 34.7 g/dL (ref 31.5–35.7)
MCV: 92 fL (ref 79–97)
Monocytes Absolute: 0.4 10*3/uL (ref 0.1–0.9)
Monocytes: 7 %
Neutrophils Absolute: 2.2 10*3/uL (ref 1.4–7.0)
Neutrophils: 41 %
Platelets: 267 10*3/uL (ref 150–450)
RBC: 4.31 x10E6/uL (ref 3.77–5.28)
RDW: 12.1 % (ref 11.7–15.4)
WBC: 5.4 10*3/uL (ref 3.4–10.8)

## 2023-08-19 LAB — HM MAMMOGRAPHY

## 2023-08-31 ENCOUNTER — Telehealth: Payer: Self-pay | Admitting: Family Medicine

## 2023-08-31 NOTE — Telephone Encounter (Signed)
 Copied from CRM [id number]. Topic: General - Other >> Aug 31, 2023  9:12 AM Travis F wrote: Reason for CRM: Patient is calling in returning a call from the office. Please follow up with patient.

## 2023-08-31 NOTE — Telephone Encounter (Signed)
 Pt had mammogram  at [facility] in [place]

## 2023-08-31 NOTE — Telephone Encounter (Signed)
 Pt needs mammo appt

## 2023-09-23 ENCOUNTER — Encounter: Payer: Self-pay | Admitting: Family Medicine

## 2023-10-08 ENCOUNTER — Other Ambulatory Visit: Payer: Self-pay | Admitting: Family Medicine

## 2023-10-08 DIAGNOSIS — E559 Vitamin D deficiency, unspecified: Secondary | ICD-10-CM

## 2023-10-21 ENCOUNTER — Ambulatory Visit (INDEPENDENT_AMBULATORY_CARE_PROVIDER_SITE_OTHER): Admitting: Family Medicine

## 2023-10-21 ENCOUNTER — Encounter: Payer: Self-pay | Admitting: Family Medicine

## 2023-10-21 VITALS — BP 135/78 | HR 60 | Temp 97.2°F | Ht 63.0 in | Wt 166.8 lb

## 2023-10-21 DIAGNOSIS — R112 Nausea with vomiting, unspecified: Secondary | ICD-10-CM

## 2023-10-21 DIAGNOSIS — R0989 Other specified symptoms and signs involving the circulatory and respiratory systems: Secondary | ICD-10-CM

## 2023-10-21 DIAGNOSIS — R519 Headache, unspecified: Secondary | ICD-10-CM | POA: Diagnosis not present

## 2023-10-21 DIAGNOSIS — R059 Cough, unspecified: Secondary | ICD-10-CM

## 2023-10-21 DIAGNOSIS — J069 Acute upper respiratory infection, unspecified: Secondary | ICD-10-CM

## 2023-10-21 MED ORDER — AMOXICILLIN-POT CLAVULANATE 875-125 MG PO TABS: 1.0000 | ORAL_TABLET | Freq: Two times a day (BID) | ORAL | 0 refills | Status: DC

## 2023-10-21 MED ORDER — BENZONATATE 100 MG PO CAPS: 100.0000 mg | ORAL_CAPSULE | Freq: Three times a day (TID) | ORAL | 0 refills | Status: DC | PRN

## 2023-10-21 MED ORDER — PREDNISONE 20 MG PO TABS: 40.0000 mg | ORAL_TABLET | Freq: Every day | ORAL | 0 refills | Status: AC

## 2023-10-21 MED ORDER — FLUTICASONE PROPIONATE 50 MCG/ACT NA SUSP: 2.0000 | Freq: Every day | NASAL | 6 refills | Status: AC

## 2023-10-21 NOTE — Progress Notes (Signed)
 Subjective:  Patient ID: Alexis Sandoval, female    DOB: [birth year], 63 y.o.   MRN: [id number]  Patient Care Team: Severa Rock HERO, FNP as PCP - General (Family Medicine)   Chief Complaint:  Cough, Nasal Congestion, Vomiting, and Headache (X 2 months - otc mucinex and sudafed )   HPI: Alexis Sandoval is a 63 y.o. female presenting on 10/21/2023 for Cough, Nasal Congestion, Vomiting, and Headache (X 2 months - otc mucinex and sudafed )   Alexis Sandoval is a 63 year old female who presents with persistent upper respiratory symptoms.  She has been experiencing excessive sleepiness for the past three months. Initially, her symptoms were concentrated in her head but later moved to her chest.  She describes frequent vomiting, occurring about twice a week, sometimes twice a day, with the last episode occurring two days ago. The vomitus is clear and she feels it coming back up in her throat.  She has been experiencing headaches, particularly at night when lying down, and a persistent cough that others notice more than she does. The cough is accompanied by mucus production.  She has taken Sudafed for the first two months and Mucinex cough and congestion a couple of times, but the latter causes significant indigestion.  No significant shortness of breath unless exposed to certain unidentified smells, and reports occasional wheezing. She occasionally experiences ear or throat pain and pressure.          Relevant past medical, surgical, family, and social history reviewed and updated as indicated.  Allergies and medications reviewed and updated. Data reviewed: Chart in Epic.   Past Medical History:  Diagnosis Date   GERD (gastroesophageal reflux disease)    does not take anything for it    Past Surgical History:  Procedure Laterality Date   BIOPSY  04/26/2021   Procedure: BIOPSY;  Surgeon: Cindie Carlin POUR, DO;  Location: [facility];  Service: Endoscopy;;   COLONOSCOPY WITH  PROPOFOL  N/A 04/26/2021   Procedure: COLONOSCOPY WITH PROPOFOL ;  Surgeon: Cindie Carlin POUR, DO;  Location: [facility];  Service: Endoscopy;  Laterality: N/A;  1:15 / ASA 2   ORIF WRIST FRACTURE Right 11/04/2014   Procedure: OPEN REDUCTION INTERNAL FIXATION (ORIF) WRIST FRACTURE;  Surgeon: Elsie Mussel, MD;  Location: [facility] OR;  Service: Orthopedics;  Laterality: Right;   PATELLA FRACTURE SURGERY     TUBAL LIGATION      Social History   Socioeconomic History   Marital status: Married    Spouse name: Quintin   Number of children: 2   Years of education: Not on file   Highest education level: Associate degree: occupational, Scientist, product/process development, or vocational program  Occupational History   Not on file  Tobacco Use   Smoking status: Never   Smokeless tobacco: Never  Vaping Use   Vaping status: Never Used  Substance and Sexual Activity   Alcohol use: No   Drug use: No   Sexual activity: Not on file  Other Topics Concern   Not on file  Social History Narrative   Not on file   Social Drivers of Health   Financial Resource Strain: Patient Declined (11/12/2022)   Overall Financial Resource Strain (CARDIA)    Difficulty of Paying Living Expenses: Patient declined  Food Insecurity: No Food Insecurity (11/12/2022)   Hunger Vital Sign    Worried About Running Out of Food in the Last Year: Never true    Ran Out of Food in the  Last Year: Never true  Transportation Needs: No Transportation Needs (11/12/2022)   PRAPARE - Administrator, Civil Service (Medical): No    Lack of Transportation (Non-Medical): No  Physical Activity: Insufficiently Active (11/12/2022)   Exercise Vital Sign    Days of Exercise per Week: 7 days    Minutes of Exercise per Session: 10 min  Stress: No Stress Concern Present (11/12/2022)   [employer] of Occupational Health - Occupational Stress Questionnaire    Feeling of Stress : Only a little  Social Connections: Socially Integrated (11/12/2022)   Social  Connection and Isolation Panel    Frequency of Communication with Friends and Family: More than three times a week    Frequency of Social Gatherings with Friends and Family: Once a week    Attends Religious Services: More than 4 times per year    Active Member of [employer] or Organizations: Yes    Attends Engineer, structural: More than 4 times per year    Marital Status: Married  Catering manager Violence: Not At Risk (12/03/2021)   Received from [facility]   Humiliation, Afraid, Rape, and Kick questionnaire    Within the last year, have you been afraid of your partner or ex-partner?: No    Within the last year, have you been humiliated or emotionally abused in other ways by your partner or ex-partner?: No    Within the last year, have you been kicked, hit, slapped, or otherwise physically hurt by your partner or ex-partner?: No    Within the last year, have you been raped or forced to have any kind of sexual activity by your partner or ex-partner?: No    Outpatient Encounter Medications as of 10/21/2023  Medication Sig   amoxicillin -clavulanate (AUGMENTIN ) 875-125 MG tablet Take 1 tablet by mouth 2 (two) times daily.   atorvastatin  (LIPITOR) 20 MG tablet Take 1 tablet (20 mg total) by mouth daily.   benzonatate  (TESSALON  PERLES) 100 MG capsule Take 1 capsule (100 mg total) by mouth 3 (three) times daily as needed.   Cyanocobalamin (VITAMIN B 12 PO) Take by mouth.   enalapril  (VASOTEC ) 2.5 MG tablet Take 1 tablet (2.5 mg total) by mouth daily.   fluticasone  (FLONASE ) 50 MCG/ACT nasal spray Place 2 sprays into both nostrils daily.   Olopatadine  HCl 0.2 % SOLN Apply 1 drop to eye daily.   predniSONE  (DELTASONE ) 20 MG tablet Take 2 tablets (40 mg total) by mouth daily with breakfast for 5 days.   triamcinolone  cream (KENALOG ) 0.1 % Apply 1 Application topically 2 (two) times daily.   Vitamin D , Ergocalciferol , (DRISDOL ) 1.25 MG (50000 UNIT) CAPS capsule TAKE 1 CAPSULE (50,000 UNITS  TOTAL) BY MOUTH EVERY 7 (SEVEN) DAYS   No facility-administered encounter medications on file as of 10/21/2023.    No Known Allergies  Pertinent ROS per HPI, otherwise unremarkable      Objective:  BP 135/78   Pulse 60   Temp (!) 97.2 F (36.2 C)   Ht 5' 3 (1.6 m)   Wt 166 lb 12.8 oz (75.7 kg)   SpO2 99%   BMI 29.55 kg/m    Wt Readings from Last 3 Encounters:  10/21/23 166 lb 12.8 oz (75.7 kg)  08/05/23 171 lb (77.6 kg)  02/03/23 174 lb 9.6 oz (79.2 kg)    Physical Exam Vitals and nursing note reviewed.  Constitutional:      General: She is not in acute distress.    Appearance: Normal  appearance. She is obese. She is not ill-appearing, toxic-appearing or diaphoretic.  HENT:     Head: Normocephalic and atraumatic.     Right Ear: A middle ear effusion is present.     Left Ear: A middle ear effusion is present.     Nose: Congestion present.     Right Turbinates: Enlarged.     Left Turbinates: Enlarged.     Right Sinus: Maxillary sinus tenderness and frontal sinus tenderness present.     Left Sinus: Maxillary sinus tenderness and frontal sinus tenderness present.     Mouth/Throat:     Lips: Pink.     Mouth: Mucous membranes are moist.     Pharynx: Posterior oropharyngeal erythema present. No pharyngeal swelling, oropharyngeal exudate, uvula swelling or postnasal drip.  Eyes:     Conjunctiva/sclera: Conjunctivae normal.     Pupils: Pupils are equal, round, and reactive to light.  Cardiovascular:     Rate and Rhythm: Normal rate and regular rhythm.     Heart sounds: Normal heart sounds.  Pulmonary:     Effort: Pulmonary effort is normal.     Breath sounds: Normal breath sounds.  Musculoskeletal:     Cervical back: Neck supple.     Right lower leg: No edema.     Left lower leg: No edema.  Lymphadenopathy:     Cervical: Cervical adenopathy present.  Skin:    General: Skin is warm and dry.     Capillary Refill: Capillary refill takes less than 2 seconds.   Neurological:     General: No focal deficit present.     Mental Status: She is alert and oriented to person, place, and time.     Cranial Nerves: No cranial nerve deficit.     Motor: No weakness.     Gait: Gait normal.  Psychiatric:        Mood and Affect: Mood normal.        Behavior: Behavior normal. Behavior is cooperative.        Thought Content: Thought content normal.        Judgment: Judgment normal.     Results for orders placed or performed in visit on 09/23/23  [facility] MAMMOGRAPHY   Collection Time: 08/19/23 10:09 AM  Result Value Ref Range   HM Mammogram 0-4 Bi-Rad 0-4 Bi-Rad, Self Reported Normal       Pertinent labs & imaging results that were available during my care of the patient were reviewed by me and considered in my medical decision making.  Assessment & Plan:  Jalise was seen today for cough, nasal congestion, vomiting and headache.  Diagnoses and all orders for this visit:  Cough in adult -     predniSONE  (DELTASONE ) 20 MG tablet; Take 2 tablets (40 mg total) by mouth daily with breakfast for 5 days. -     amoxicillin -clavulanate (AUGMENTIN ) 875-125 MG tablet; Take 1 tablet by mouth 2 (two) times daily. -     fluticasone  (FLONASE ) 50 MCG/ACT nasal spray; Place 2 sprays into both nostrils daily. -     benzonatate  (TESSALON  PERLES) 100 MG capsule; Take 1 capsule (100 mg total) by mouth 3 (three) times daily as needed.  Chest congestion -     predniSONE  (DELTASONE ) 20 MG tablet; Take 2 tablets (40 mg total) by mouth daily with breakfast for 5 days. -     amoxicillin -clavulanate (AUGMENTIN ) 875-125 MG tablet; Take 1 tablet by mouth 2 (two) times daily. -     fluticasone  (FLONASE ) 50  MCG/ACT nasal spray; Place 2 sprays into both nostrils daily. -     benzonatate  (TESSALON  PERLES) 100 MG capsule; Take 1 capsule (100 mg total) by mouth 3 (three) times daily as needed.  Nocturnal headaches -     predniSONE  (DELTASONE ) 20 MG tablet; Take 2 tablets (40 mg total)  by mouth daily with breakfast for 5 days. -     amoxicillin -clavulanate (AUGMENTIN ) 875-125 MG tablet; Take 1 tablet by mouth 2 (two) times daily. -     fluticasone  (FLONASE ) 50 MCG/ACT nasal spray; Place 2 sprays into both nostrils daily. -     benzonatate  (TESSALON  PERLES) 100 MG capsule; Take 1 capsule (100 mg total) by mouth 3 (three) times daily as needed.  Nausea and vomiting in adult -     predniSONE  (DELTASONE ) 20 MG tablet; Take 2 tablets (40 mg total) by mouth daily with breakfast for 5 days. -     amoxicillin -clavulanate (AUGMENTIN ) 875-125 MG tablet; Take 1 tablet by mouth 2 (two) times daily. -     fluticasone  (FLONASE ) 50 MCG/ACT nasal spray; Place 2 sprays into both nostrils daily. -     benzonatate  (TESSALON  PERLES) 100 MG capsule; Take 1 capsule (100 mg total) by mouth 3 (three) times daily as needed.  URI with cough and congestion -     predniSONE  (DELTASONE ) 20 MG tablet; Take 2 tablets (40 mg total) by mouth daily with breakfast for 5 days. -     amoxicillin -clavulanate (AUGMENTIN ) 875-125 MG tablet; Take 1 tablet by mouth 2 (two) times daily. -     fluticasone  (FLONASE ) 50 MCG/ACT nasal spray; Place 2 sprays into both nostrils daily. -     benzonatate  (TESSALON  PERLES) 100 MG capsule; Take 1 capsule (100 mg total) by mouth 3 (three) times daily as needed.     Acute upper respiratory infection Experiencing symptoms for three months, including headaches, cough, congestion, nausea, vomiting, and wheezing. Symptoms initially started in the head and moved to the chest. Vomiting occurs about twice a week, sometimes twice a day. No evidence of pneumonia. Condition assessed as an upper respiratory infection with fluid behind the ears, indicating inflamed and swollen eustachian tubes and sinuses. - Prescribe prednisone  - Prescribe Augmentin  - Prescribe Flonase  - Continue Mucinex with increased water intake - Send prescriptions to [facility] - Instruct to take medications as  prescribed - Advise to report if not improving after a couple of days of medication - Advise to report if symptoms worsen       Continue all other maintenance medications.  Follow up plan: Return if symptoms worsen or fail to improve.   Continue healthy lifestyle choices, including diet (rich in fruits, vegetables, and lean proteins, and low in salt and simple carbohydrates) and exercise (at least 30 minutes of moderate physical activity daily).  Educational handout given for URI  The above assessment and management plan was discussed with the patient. The patient verbalized understanding of and has agreed to the management plan. Patient is aware to call the clinic if they develop any new symptoms or if symptoms persist or worsen. Patient is aware when to return to the clinic for a follow-up visit. Patient educated on when it is appropriate to go to the emergency department.   Rosaline Bruns, FNP-C [facility] [phone]

## 2023-11-13 ENCOUNTER — Other Ambulatory Visit: Payer: Self-pay | Admitting: Family Medicine

## 2023-11-13 DIAGNOSIS — E559 Vitamin D deficiency, unspecified: Secondary | ICD-10-CM

## 2023-11-26 ENCOUNTER — Telehealth (INDEPENDENT_AMBULATORY_CARE_PROVIDER_SITE_OTHER): Admitting: Family Medicine

## 2023-11-26 DIAGNOSIS — Z91199 Patient's noncompliance with other medical treatment and regimen due to unspecified reason: Secondary | ICD-10-CM

## 2023-11-26 NOTE — Progress Notes (Signed)
 Link sent to cell phone twice. Called twice, no answer. Pt did not connect for video visit.

## 2023-12-16 ENCOUNTER — Other Ambulatory Visit: Payer: Self-pay | Admitting: Family Medicine

## 2023-12-16 DIAGNOSIS — L2084 Intrinsic (allergic) eczema: Secondary | ICD-10-CM

## 2024-02-05 ENCOUNTER — Encounter: Payer: Self-pay | Admitting: Family Medicine

## 2024-02-05 ENCOUNTER — Other Ambulatory Visit: Payer: Self-pay

## 2024-02-05 ENCOUNTER — Ambulatory Visit: Payer: Self-pay | Admitting: Family Medicine

## 2024-02-05 VITALS — BP 136/77 | HR 67 | Temp 97.8°F | Ht 63.0 in | Wt 169.6 lb

## 2024-02-05 DIAGNOSIS — K219 Gastro-esophageal reflux disease without esophagitis: Secondary | ICD-10-CM

## 2024-02-05 DIAGNOSIS — J310 Chronic rhinitis: Secondary | ICD-10-CM

## 2024-02-05 NOTE — Progress Notes (Signed)
 Acute Office Visit  Subjective:     Patient ID: Alexis Sandoval, female    DOB: 11/03/1960, 63 y.o.   MRN: [id number]  Chief Complaint  Patient presents with   Cough    HPI  History of Present Illness   Alexis Sandoval is a 63 year old female with GERD who presents with chronic cough and congestion.  Chronic cough and upper respiratory symptoms - Chronic cough and congestion for several months - Some improvement in symptoms over the past two days - Persistent postnasal drainage leading to vomiting at least twice daily - Clear sputum production - Tickling sensation on the side of nose and cheek - Crusty sensation in one ear - No ear pain - No sore throat, but throat feels dry - No history of asthma, COPD, or bronchitis - No dyspnea - Believes symptoms are due to a bacterial sinus infection given how long that have been going on for  Symptom management and response to treatment - Uses over-the-counter Vicks 44 and Claritin with partial relief of symptoms  Gastroesophageal reflux disease (gerd) symptoms - History of GERD, currently taking Prevacid - Heartburn has been more intense recently - No pain after eating       ROS As per HPI.      Objective:    BP 136/77   Pulse 67   Temp 97.8 F (36.6 C) (Temporal)   Ht 5' 3 (1.6 m)   Wt 169 lb 9.6 oz (76.9 kg)   SpO2 98%   BMI 30.04 kg/m    Physical Exam Vitals and nursing note reviewed.  Constitutional:      General: She is not in acute distress.    Appearance: Normal appearance. She is not ill-appearing, toxic-appearing or diaphoretic.  HENT:     Head: Normocephalic and atraumatic.     Right Ear: Ear canal and external ear normal. A middle ear effusion is present.     Left Ear: Ear canal and external ear normal. A middle ear effusion is present.     Nose: Congestion present.     Right Sinus: No maxillary sinus tenderness or frontal sinus tenderness.     Left Sinus: No maxillary sinus tenderness or  frontal sinus tenderness.     Mouth/Throat:     Mouth: Mucous membranes are moist.     Pharynx: Oropharynx is clear. No oropharyngeal exudate or posterior oropharyngeal erythema.  Eyes:     General:        Right eye: No discharge.        Left eye: No discharge.     Conjunctiva/sclera: Conjunctivae normal.  Cardiovascular:     Rate and Rhythm: Normal rate and regular rhythm.     Heart sounds: Normal heart sounds. No murmur heard. Pulmonary:     Effort: Pulmonary effort is normal. No respiratory distress.     Breath sounds: Normal breath sounds. No wheezing, rhonchi or rales.  Musculoskeletal:     Cervical back: Neck supple.     Right lower leg: No edema.     Left lower leg: No edema.  Lymphadenopathy:     Cervical: No cervical adenopathy.  Skin:    General: Skin is warm and dry.  Neurological:     General: No focal deficit present.     Mental Status: She is alert and oriented to person, place, and time.  Psychiatric:        Mood and Affect: Mood normal.  Behavior: Behavior normal.     No results found for any visits on 02/05/24.      Assessment & Plan:   Aziza was seen today for cough.  Diagnoses and all orders for this visit:  Chronic rhinitis  Gastroesophageal reflux disease without esophagitis  Assessment and Plan    Chronic rhinitis Chronic cough and congestion likely due to environmental allergies. Symptoms improved but persistent. Examination showed fluid behind ears without infection. No acute findings.  - Recommend Xyzal at bedtime. - Restart flonase   Gastroesophageal reflux disease GERD with increased symptom intensity, possibly contributing to vomiting and coughing, especially when supine. - Continue Prevacid. Follow up with PCP if symptoms persist or worsen despite treatment of chronic rhinitis.      Return to office for new or worsening symptoms, or if symptoms persist.    Alexis CHRISTELLA Search, FNP

## 2024-02-26 ENCOUNTER — Ambulatory Visit: Payer: Self-pay | Admitting: Family Medicine

## 2024-02-26 ENCOUNTER — Ambulatory Visit (INDEPENDENT_AMBULATORY_CARE_PROVIDER_SITE_OTHER): Admitting: Family Medicine

## 2024-02-26 ENCOUNTER — Encounter: Payer: Self-pay | Admitting: Family Medicine

## 2024-02-26 VITALS — BP 134/82 | HR 59 | Temp 97.4°F | Ht 63.0 in | Wt 167.8 lb

## 2024-02-26 DIAGNOSIS — B379 Candidiasis, unspecified: Secondary | ICD-10-CM

## 2024-02-26 DIAGNOSIS — N898 Other specified noninflammatory disorders of vagina: Secondary | ICD-10-CM | POA: Diagnosis not present

## 2024-02-26 DIAGNOSIS — N289 Disorder of kidney and ureter, unspecified: Secondary | ICD-10-CM

## 2024-02-26 DIAGNOSIS — R300 Dysuria: Secondary | ICD-10-CM | POA: Diagnosis not present

## 2024-02-26 DIAGNOSIS — R7303 Prediabetes: Secondary | ICD-10-CM

## 2024-02-26 DIAGNOSIS — E559 Vitamin D deficiency, unspecified: Secondary | ICD-10-CM | POA: Diagnosis not present

## 2024-02-26 DIAGNOSIS — E782 Mixed hyperlipidemia: Secondary | ICD-10-CM

## 2024-02-26 DIAGNOSIS — Z6829 Body mass index (BMI) 29.0-29.9, adult: Secondary | ICD-10-CM

## 2024-02-26 LAB — CMP14+EGFR
ALT: 24 IU/L (ref 0–32)
AST: 19 IU/L (ref 0–40)
Albumin: 4.2 g/dL (ref 3.9–4.9)
Alkaline Phosphatase: 86 IU/L (ref 49–135)
BUN/Creatinine Ratio: 15 (ref 12–28)
BUN: 15 mg/dL (ref 8–27)
Bilirubin Total: 0.5 mg/dL (ref 0.0–1.2)
CO2: 21 mmol/L (ref 20–29)
Calcium: 9.2 mg/dL (ref 8.7–10.3)
Chloride: 105 mmol/L (ref 96–106)
Creatinine, Ser: 1 mg/dL (ref 0.57–1.00)
Globulin, Total: 2.5 g/dL (ref 1.5–4.5)
Glucose: 101 mg/dL — ABNORMAL HIGH (ref 70–99)
Potassium: 4.4 mmol/L (ref 3.5–5.2)
Sodium: 140 mmol/L (ref 134–144)
Total Protein: 6.7 g/dL (ref 6.0–8.5)
eGFR: 63 mL/min/1.73

## 2024-02-26 LAB — URINALYSIS, ROUTINE W REFLEX MICROSCOPIC
Bilirubin, UA: NEGATIVE
Glucose, UA: NEGATIVE
Ketones, UA: NEGATIVE
Nitrite, UA: NEGATIVE
Protein,UA: NEGATIVE
RBC, UA: NEGATIVE
Specific Gravity, UA: 1.02 (ref 1.005–1.030)
Urobilinogen, Ur: 0.2 mg/dL (ref 0.2–1.0)
pH, UA: 6.5 (ref 5.0–7.5)

## 2024-02-26 LAB — MICROSCOPIC EXAMINATION: Renal Epithel, UA: NONE SEEN /HPF

## 2024-02-26 LAB — WET PREP FOR TRICH, YEAST, CLUE
Clue Cell Exam: NEGATIVE
Trichomonas Exam: NEGATIVE
Yeast Exam: NEGATIVE

## 2024-02-26 LAB — BAYER DCA HB A1C WAIVED: HB A1C (BAYER DCA - WAIVED): 6.3 % — ABNORMAL HIGH (ref 4.8–5.6)

## 2024-02-26 MED ORDER — FLUCONAZOLE 150 MG PO TABS: ORAL_TABLET | ORAL | 0 refills | Status: AC

## 2024-02-26 MED ORDER — VITAMIN D (ERGOCALCIFEROL) 1.25 MG (50000 UNIT) PO CAPS: 50000.0000 [IU] | ORAL_CAPSULE | ORAL | 0 refills | Status: AC

## 2024-02-26 MED ORDER — ATORVASTATIN CALCIUM 20 MG PO TABS: 20.0000 mg | ORAL_TABLET | Freq: Every day | ORAL | 1 refills | Status: AC

## 2024-02-26 MED ORDER — ENALAPRIL MALEATE 2.5 MG PO TABS: 2.5000 mg | ORAL_TABLET | Freq: Every day | ORAL | 1 refills | Status: AC

## 2024-02-26 NOTE — Progress Notes (Signed)
 "    Subjective:  Patient ID: Alexis Sandoval, female    DOB: 06-30-1960, 63 y.o.   MRN: [id number]  Patient Care Team: Severa Rock HERO, FNP as PCP - General (Family Medicine)   Chief Complaint:  Medical Management of Chronic Issues (6 month follow up ) and Vaginal Itching (X 2 days )   HPI: Alexis Sandoval is a 63 y.o. female presenting on 02/26/2024 for Medical Management of Chronic Issues (6 month follow up ) and Vaginal Itching (X 2 days )   Alexis Sandoval is a 63 year old female who presents with symptoms suggestive of a yeast infection.  She experiences burning during urination and itching afterwards. She has not taken any antibiotics recently, with the last course being prior to her previous visit.  She recalls a prolonged illness that took about eight months to resolve, during which she experienced significant mucus production but no fever.  She is currently taking enalapril  for renal protection and atorvastatin  for cholesterol management. She has switched her enalapril  dosing to the morning to avoid frequent nighttime urination.  No increased hunger or thirst, but she acknowledges increased urination. Her last A1c was 6. No changes in vision, dizziness, sweatiness, or shakiness.  Her social history includes caring for elderly individuals, with one passing away out of seven during her illness. Her grandchildren were unable to visit due to the flu.          Relevant past medical, surgical, family, and social history reviewed and updated as indicated.  Allergies and medications reviewed and updated. Data reviewed: Chart in Epic.   Past Medical History:  Diagnosis Date   GERD (gastroesophageal reflux disease)    does not take anything for it    Past Surgical History:  Procedure Laterality Date   BIOPSY  04/26/2021   Procedure: BIOPSY;  Surgeon: Cindie Carlin POUR, DO;  Location: [facility];  Service: Endoscopy;;   COLONOSCOPY WITH PROPOFOL  N/A 04/26/2021    Procedure: COLONOSCOPY WITH PROPOFOL ;  Surgeon: Cindie Carlin POUR, DO;  Location: [facility];  Service: Endoscopy;  Laterality: N/A;  1:15 / ASA 2   ORIF WRIST FRACTURE Right 11/04/2014   Procedure: OPEN REDUCTION INTERNAL FIXATION (ORIF) WRIST FRACTURE;  Surgeon: Elsie Mussel, MD;  Location: [facility] OR;  Service: Orthopedics;  Laterality: Right;   PATELLA FRACTURE SURGERY     TUBAL LIGATION      Social History   Socioeconomic History   Marital status: Married    Spouse name: Quintin   Number of children: 2   Years of education: Not on file   Highest education level: Associate degree: occupational, scientist, product/process development, or vocational program  Occupational History   Not on file  Tobacco Use   Smoking status: Never   Smokeless tobacco: Never  Vaping Use   Vaping status: Never Used  Substance and Sexual Activity   Alcohol use: No   Drug use: No   Sexual activity: Not on file  Other Topics Concern   Not on file  Social History Narrative   Not on file   Social Drivers of Health   Tobacco Use: Low Risk (02/26/2024)   Patient History    Smoking Tobacco Use: Never    Smokeless Tobacco Use: Never    Passive Exposure: Not on file  Financial Resource Strain: Patient Declined (11/12/2022)   Overall Financial Resource Strain (CARDIA)    Difficulty of Paying Living Expenses: Patient declined  Food Insecurity: No Food Insecurity (11/12/2022)  Hunger Vital Sign    Worried About Running Out of Food in the Last Year: Never true    Ran Out of Food in the Last Year: Never true  Transportation Needs: No Transportation Needs (11/12/2022)   PRAPARE - Administrator, Civil Service (Medical): No    Lack of Transportation (Non-Medical): No  Physical Activity: Insufficiently Active (11/12/2022)   Exercise Vital Sign    Days of Exercise per Week: 7 days    Minutes of Exercise per Session: 10 min  Stress: No Stress Concern Present (11/12/2022)   [employer] of Occupational Health -  Occupational Stress Questionnaire    Feeling of Stress : Only a little  Social Connections: Socially Integrated (11/12/2022)   Social Connection and Isolation Panel    Frequency of Communication with Friends and Family: More than three times a week    Frequency of Social Gatherings with Friends and Family: Once a week    Attends Religious Services: More than 4 times per year    Active Member of [employer] or Organizations: Yes    Attends Banker Meetings: More than 4 times per year    Marital Status: Married  Catering Manager Violence: Not At Risk (12/03/2021)   Received from [facility]   Epic    Within the last year, have you been afraid of your partner or ex-partner?: No    Within the last year, have you been humiliated or emotionally abused in other ways by your partner or ex-partner?: No    Within the last year, have you been kicked, hit, slapped, or otherwise physically hurt by your partner or ex-partner?: No    Within the last year, have you been raped or forced to have any kind of sexual activity by your partner or ex-partner?: No  Depression (PHQ2-9): Low Risk (02/26/2024)   Depression (PHQ2-9)    PHQ-2 Score: 0  Alcohol Screen: Low Risk (11/12/2022)   Alcohol Screen    Last Alcohol Screening Score (AUDIT): 1  Housing: Medium Risk (11/12/2022)   Housing    Last Housing Risk Score: 1  Utilities: Not on file  Health Literacy: Low Risk (12/03/2021)   Received from [facility] Literacy    How often do you need to have someone help you when you read instructions, pamphlets, or other written material from your doctor or pharmacy?: Never    Outpatient Encounter Medications as of 02/26/2024  Medication Sig   Cyanocobalamin (VITAMIN B 12 PO) Take by mouth.   fluconazole  (DIFLUCAN ) 150 MG tablet 1 po q week x 4 weeks   fluticasone  (FLONASE ) 50 MCG/ACT nasal spray Place 2 sprays into both nostrils daily.   Olopatadine  HCl 0.2 % SOLN Apply 1 drop to eye daily.    triamcinolone  cream (KENALOG ) 0.1 % APPLY TO AFFECTED AREA TWICE A DAY   [DISCONTINUED] atorvastatin  (LIPITOR) 20 MG tablet Take 1 tablet (20 mg total) by mouth daily.   [DISCONTINUED] enalapril  (VASOTEC ) 2.5 MG tablet Take 1 tablet (2.5 mg total) by mouth daily.   [DISCONTINUED] Vitamin D , Ergocalciferol , (DRISDOL ) 1.25 MG (50000 UNIT) CAPS capsule TAKE 1 CAPSULE (50,000 UNITS TOTAL) BY MOUTH EVERY 7 (SEVEN) DAYS   atorvastatin  (LIPITOR) 20 MG tablet Take 1 tablet (20 mg total) by mouth daily.   enalapril  (VASOTEC ) 2.5 MG tablet Take 1 tablet (2.5 mg total) by mouth daily.   Vitamin D , Ergocalciferol , (DRISDOL ) 1.25 MG (50000 UNIT) CAPS capsule Take 1 capsule (50,000 Units total)  by mouth every 7 (seven) days.   No facility-administered encounter medications on file as of 02/26/2024.    Allergies[1]  Pertinent ROS per HPI, otherwise unremarkable      Objective:  BP 134/82   Pulse (!) 59   Temp (!) 97.4 F (36.3 C)   Ht 5' 3 (1.6 m)   Wt 167 lb 12.8 oz (76.1 kg)   SpO2 98%   BMI 29.72 kg/m    Wt Readings from Last 3 Encounters:  02/26/24 167 lb 12.8 oz (76.1 kg)  02/05/24 169 lb 9.6 oz (76.9 kg)  10/21/23 166 lb 12.8 oz (75.7 kg)    Physical Exam Vitals and nursing note reviewed.  Constitutional:      General: She is not in acute distress.    Appearance: Normal appearance. She is well-developed, well-groomed and overweight. She is not ill-appearing, toxic-appearing or diaphoretic.  HENT:     Head: Normocephalic and atraumatic.     Jaw: There is normal jaw occlusion.     Right Ear: Hearing normal.     Left Ear: Hearing normal.     Nose: Nose normal.     Mouth/Throat:     Lips: Pink.     Mouth: Mucous membranes are moist.     Pharynx: Oropharynx is clear. Uvula midline.  Eyes:     General: Lids are normal.     Extraocular Movements: Extraocular movements intact.     Conjunctiva/sclera: Conjunctivae normal.     Pupils: Pupils are equal, round, and reactive to  light.  Neck:     Thyroid : No thyroid  mass, thyromegaly or thyroid  tenderness.     Vascular: No carotid bruit or JVD.     Trachea: Trachea and phonation normal.  Cardiovascular:     Rate and Rhythm: Normal rate and regular rhythm.     Chest Wall: PMI is not displaced.     Pulses: Normal pulses.     Heart sounds: Normal heart sounds. No murmur heard.    No friction rub. No gallop.  Pulmonary:     Effort: Pulmonary effort is normal. No respiratory distress.     Breath sounds: Normal breath sounds. No wheezing.  Abdominal:     General: Bowel sounds are normal. There is no distension or abdominal bruit.     Palpations: Abdomen is soft. There is no hepatomegaly or splenomegaly.     Tenderness: There is no abdominal tenderness. There is no right CVA tenderness or left CVA tenderness.     Hernia: No hernia is present.  Musculoskeletal:        General: Normal range of motion.     Cervical back: Normal range of motion and neck supple.     Right lower leg: No edema.     Left lower leg: No edema.  Lymphadenopathy:     Cervical: No cervical adenopathy.  Skin:    General: Skin is warm and dry.     Capillary Refill: Capillary refill takes less than 2 seconds.     Coloration: Skin is not cyanotic, jaundiced or pale.     Findings: No rash.  Neurological:     General: No focal deficit present.     Mental Status: She is alert and oriented to person, place, and time.     Sensory: Sensation is intact.     Motor: Motor function is intact.     Coordination: Coordination is intact.     Gait: Gait is intact.     Deep Tendon Reflexes: Reflexes  are normal and symmetric.  Psychiatric:        Attention and Perception: Attention and perception normal.        Mood and Affect: Mood and affect normal.        Speech: Speech normal.        Behavior: Behavior normal. Behavior is cooperative.        Thought Content: Thought content normal.        Cognition and Memory: Cognition and memory normal.         Judgment: Judgment normal.    Physical Exam            Results for orders placed or performed in visit on 09/23/23  [facility] MAMMOGRAPHY   Collection Time: 08/19/23 10:09 AM  Result Value Ref Range   HM Mammogram 0-4 Bi-Rad 0-4 Bi-Rad, Self Reported Normal       Pertinent labs & imaging results that were available during my care of the patient were reviewed by me and considered in my medical decision making.  Assessment & Plan:  Alexis Sandoval was seen today for medical management of chronic issues and vaginal itching.  Diagnoses and all orders for this visit:  Mixed hyperlipidemia -     CMP14+EGFR -     atorvastatin  (LIPITOR) 20 MG tablet; Take 1 tablet (20 mg total) by mouth daily.  Renal function impairment -     CMP14+EGFR -     enalapril  (VASOTEC ) 2.5 MG tablet; Take 1 tablet (2.5 mg total) by mouth daily.  Vitamin D  deficiency -     CMP14+EGFR -     Vitamin D , Ergocalciferol , (DRISDOL ) 1.25 MG (50000 UNIT) CAPS capsule; Take 1 capsule (50,000 Units total) by mouth every 7 (seven) days.  Prediabetes -     CMP14+EGFR -     Bayer DCA Hb A1c Waived  Vaginal itching -     WET PREP FOR TRICH, YEAST, CLUE  Dysuria -     Urinalysis, Routine w reflex microscopic -     Urine Culture  BMI 29.0-29.9,adult -     CMP14+EGFR -     Bayer DCA Hb A1c Waived  Yeast infection -     fluconazole  (DIFLUCAN ) 150 MG tablet; 1 po q week x 4 weeks       Acute vaginitis with dysuria Burning sensation during urination and itching post-urination suggest a possible yeast infection. No recent antibiotic use. - Ordered urine analysis and wet prep to check for urinary infection or yeast infection.  Prediabetes A1c increased to 6.3, slightly above previous level of 6.0. No symptoms of increased hunger, thirst, or dizziness. Decision to delay medication initiation as A1c is below 6.5. - Continue diet and exercise regimen. - Will recheck A1c in six months.  Mixed hyperlipidemia Continues  atorvastatin  without muscle aches or pains.  Renal function impairment Continues enalapril  for renal protection without dizziness or weakness. - Ordered CMP to assess kidney function, including creatinine and GFR.  Vitamin D  deficiency Continues vitamin D  supplementation without issues.  Overweight, BMI 29-29.9 BMI remains stable. No changes in weight reported. - Encouraged continued exercise and healthy diet.          Continue all other maintenance medications.  Follow up plan: Return in about 6 months (around 08/26/2024) for Annual Physical.   Continue healthy lifestyle choices, including diet (rich in fruits, vegetables, and lean proteins, and low in salt and simple carbohydrates) and exercise (at least 30 minutes of moderate physical activity daily).  Educational handout given  for prediabetes   The above assessment and management plan was discussed with the patient. The patient verbalized understanding of and has agreed to the management plan. Patient is aware to call the clinic if they develop any new symptoms or if symptoms persist or worsen. Patient is aware when to return to the clinic for a follow-up visit. Patient educated on when it is appropriate to go to the emergency department.   Rosaline Bruns, FNP-C [facility] [phone]     [1] No Known Allergies  "

## 2024-02-28 LAB — URINE CULTURE: Organism ID, Bacteria: NO GROWTH

## 2024-04-13 ENCOUNTER — Encounter: Payer: Self-pay | Admitting: Family Medicine

## 2024-08-05 ENCOUNTER — Encounter: Admitting: Family Medicine

## 2024-08-16 ENCOUNTER — Encounter: Admitting: Family Medicine

## 2024-08-26 ENCOUNTER — Encounter: Admitting: Family Medicine
